# Patient Record
Sex: Male | Born: 1980 | Race: White | Hispanic: No | Marital: Married | Smoking: Never smoker
Health system: Southern US, Community
[De-identification: ages and names within clinical notes are randomized; demographics above are authoritative.]

## PROBLEM LIST (undated history)

## (undated) DIAGNOSIS — I10 Essential (primary) hypertension: Secondary | ICD-10-CM

## (undated) HISTORY — PX: BILATERAL CARPAL TUNNEL RELEASE: SHX6508

## (undated) HISTORY — PX: CARPAL TUNNEL WITH CUBITAL TUNNEL: SHX5608

---

## 2008-01-17 ENCOUNTER — Emergency Department: Payer: Self-pay | Admitting: Emergency Medicine

## 2014-08-29 DIAGNOSIS — G5603 Carpal tunnel syndrome, bilateral upper limbs: Secondary | ICD-10-CM | POA: Insufficient documentation

## 2015-06-14 DIAGNOSIS — R350 Frequency of micturition: Secondary | ICD-10-CM | POA: Insufficient documentation

## 2015-08-01 DIAGNOSIS — I10 Essential (primary) hypertension: Secondary | ICD-10-CM | POA: Insufficient documentation

## 2015-08-16 DIAGNOSIS — R202 Paresthesia of skin: Secondary | ICD-10-CM | POA: Insufficient documentation

## 2015-08-16 DIAGNOSIS — R200 Anesthesia of skin: Secondary | ICD-10-CM | POA: Insufficient documentation

## 2015-08-16 DIAGNOSIS — M79603 Pain in arm, unspecified: Secondary | ICD-10-CM | POA: Insufficient documentation

## 2015-10-24 DIAGNOSIS — R519 Headache, unspecified: Secondary | ICD-10-CM | POA: Insufficient documentation

## 2016-05-08 DIAGNOSIS — R3915 Urgency of urination: Secondary | ICD-10-CM | POA: Insufficient documentation

## 2016-05-08 DIAGNOSIS — R1031 Right lower quadrant pain: Secondary | ICD-10-CM | POA: Insufficient documentation

## 2016-05-11 DIAGNOSIS — M25532 Pain in left wrist: Secondary | ICD-10-CM | POA: Insufficient documentation

## 2016-05-22 DIAGNOSIS — R3129 Other microscopic hematuria: Secondary | ICD-10-CM | POA: Insufficient documentation

## 2016-05-27 DIAGNOSIS — M19032 Primary osteoarthritis, left wrist: Secondary | ICD-10-CM | POA: Insufficient documentation

## 2019-02-07 ENCOUNTER — Other Ambulatory Visit: Payer: Self-pay | Admitting: Gastroenterology

## 2019-02-07 ENCOUNTER — Other Ambulatory Visit (HOSPITAL_COMMUNITY): Payer: Self-pay | Admitting: Gastroenterology

## 2019-02-07 DIAGNOSIS — R1011 Right upper quadrant pain: Secondary | ICD-10-CM

## 2019-02-07 DIAGNOSIS — K8020 Calculus of gallbladder without cholecystitis without obstruction: Secondary | ICD-10-CM

## 2019-02-10 ENCOUNTER — Other Ambulatory Visit: Payer: Self-pay | Admitting: Internal Medicine

## 2019-02-10 ENCOUNTER — Other Ambulatory Visit: Payer: Self-pay

## 2019-02-10 ENCOUNTER — Ambulatory Visit
Admission: RE | Admit: 2019-02-10 | Discharge: 2019-02-10 | Disposition: A | Discharge status: Home / Self Care | Payer: BLUE CROSS/BLUE SHIELD | Source: Ambulatory Visit | Attending: Gastroenterology | Admitting: Gastroenterology

## 2019-02-10 DIAGNOSIS — R1011 Right upper quadrant pain: Secondary | ICD-10-CM

## 2019-02-10 DIAGNOSIS — R112 Nausea with vomiting, unspecified: Secondary | ICD-10-CM

## 2019-02-10 DIAGNOSIS — K8020 Calculus of gallbladder without cholecystitis without obstruction: Secondary | ICD-10-CM | POA: Diagnosis present

## 2019-02-15 ENCOUNTER — Ambulatory Visit
Admission: RE | Admit: 2019-02-15 | Discharge: 2019-02-15 | Disposition: A | Discharge status: Home / Self Care | Payer: BLUE CROSS/BLUE SHIELD | Source: Ambulatory Visit | Attending: Internal Medicine | Admitting: Internal Medicine

## 2019-02-15 ENCOUNTER — Other Ambulatory Visit: Payer: Self-pay

## 2019-02-15 DIAGNOSIS — R1011 Right upper quadrant pain: Secondary | ICD-10-CM | POA: Diagnosis present

## 2019-02-15 DIAGNOSIS — R112 Nausea with vomiting, unspecified: Secondary | ICD-10-CM | POA: Diagnosis present

## 2019-02-15 DIAGNOSIS — K8020 Calculus of gallbladder without cholecystitis without obstruction: Secondary | ICD-10-CM | POA: Diagnosis present

## 2019-02-15 MED ORDER — TECHNETIUM TC 99M MEBROFENIN IV KIT
5.3500 | PACK | Freq: Once | INTRAVENOUS | Status: AC | PRN
  Administered 2019-02-15: 09:00:00 5.35 via INTRAVENOUS

## 2019-02-23 ENCOUNTER — Other Ambulatory Visit
Admission: RE | Admit: 2019-02-23 | Discharge: 2019-02-23 | Disposition: A | Discharge status: Home / Self Care | Payer: BLUE CROSS/BLUE SHIELD | Source: Ambulatory Visit | Attending: Gastroenterology | Admitting: Gastroenterology

## 2019-02-23 ENCOUNTER — Other Ambulatory Visit: Payer: Self-pay | Admitting: Gastroenterology

## 2019-02-23 DIAGNOSIS — R1012 Left upper quadrant pain: Secondary | ICD-10-CM

## 2019-02-23 DIAGNOSIS — R197 Diarrhea, unspecified: Secondary | ICD-10-CM

## 2019-02-23 DIAGNOSIS — R112 Nausea with vomiting, unspecified: Secondary | ICD-10-CM

## 2019-02-23 DIAGNOSIS — R1013 Epigastric pain: Secondary | ICD-10-CM

## 2019-02-23 LAB — GASTROINTESTINAL PANEL BY PCR, STOOL (REPLACES STOOL CULTURE)

## 2019-02-23 LAB — C DIFFICILE QUICK SCREEN W PCR REFLEX
C Diff antigen: NEGATIVE
C Diff interpretation: NOT DETECTED
C Diff toxin: NEGATIVE

## 2019-02-26 LAB — CALPROTECTIN, FECAL: Calprotectin, Fecal: 68 ug/g (ref 0–120)

## 2019-03-03 ENCOUNTER — Other Ambulatory Visit: Payer: Self-pay

## 2019-03-03 ENCOUNTER — Ambulatory Visit
Admission: RE | Admit: 2019-03-03 | Discharge: 2019-03-03 | Disposition: A | Discharge status: Home / Self Care | Payer: BLUE CROSS/BLUE SHIELD | Source: Ambulatory Visit | Attending: Gastroenterology | Admitting: Gastroenterology

## 2019-03-03 DIAGNOSIS — R1012 Left upper quadrant pain: Secondary | ICD-10-CM | POA: Diagnosis present

## 2019-03-03 DIAGNOSIS — R1013 Epigastric pain: Secondary | ICD-10-CM | POA: Diagnosis present

## 2019-03-03 DIAGNOSIS — R112 Nausea with vomiting, unspecified: Secondary | ICD-10-CM | POA: Diagnosis present

## 2019-03-03 DIAGNOSIS — R197 Diarrhea, unspecified: Secondary | ICD-10-CM | POA: Diagnosis present

## 2019-03-03 HISTORY — DX: Essential (primary) hypertension: I10

## 2019-03-03 MED ORDER — IOHEXOL 300 MG/ML  SOLN
100.0000 mL | Freq: Once | INTRAMUSCULAR | Status: AC | PRN
  Administered 2019-03-03: 09:00:00 100 mL via INTRAVENOUS

## 2019-04-20 ENCOUNTER — Other Ambulatory Visit
Admission: RE | Admit: 2019-04-20 | Discharge: 2019-04-20 | Disposition: A | Discharge status: Home / Self Care | Payer: BC Managed Care – PPO | Source: Ambulatory Visit | Attending: Gastroenterology | Admitting: Gastroenterology

## 2019-04-20 DIAGNOSIS — K580 Irritable bowel syndrome with diarrhea: Secondary | ICD-10-CM | POA: Insufficient documentation

## 2019-04-26 LAB — PANCREATIC ELASTASE, FECAL: Pancreatic Elastase-1, Stool: >500 ug Elast./g (ref >200)

## 2019-05-11 ENCOUNTER — Other Ambulatory Visit: Payer: Self-pay

## 2019-05-12 ENCOUNTER — Other Ambulatory Visit: Payer: Self-pay

## 2019-05-12 ENCOUNTER — Encounter: Payer: Self-pay | Admitting: Oncology

## 2019-05-12 ENCOUNTER — Inpatient Hospital Stay: Payer: BC Managed Care – PPO | Attending: Oncology | Admitting: Oncology

## 2019-05-12 ENCOUNTER — Inpatient Hospital Stay: Payer: BC Managed Care – PPO

## 2019-05-12 VITALS — BP 168/94 | HR 105 | Temp 99.6°F | Wt 313.5 lb

## 2019-05-12 DIAGNOSIS — Z79899 Other long term (current) drug therapy: Secondary | ICD-10-CM | POA: Insufficient documentation

## 2019-05-12 DIAGNOSIS — D72829 Elevated white blood cell count, unspecified: Secondary | ICD-10-CM | POA: Diagnosis not present

## 2019-05-12 DIAGNOSIS — M25559 Pain in unspecified hip: Secondary | ICD-10-CM

## 2019-05-12 DIAGNOSIS — R197 Diarrhea, unspecified: Secondary | ICD-10-CM

## 2019-05-12 DIAGNOSIS — I10 Essential (primary) hypertension: Secondary | ICD-10-CM | POA: Diagnosis not present

## 2019-05-12 DIAGNOSIS — R5383 Other fatigue: Secondary | ICD-10-CM

## 2019-05-12 DIAGNOSIS — E538 Deficiency of other specified B group vitamins: Secondary | ICD-10-CM

## 2019-05-12 LAB — COMPREHENSIVE METABOLIC PANEL
ALT: 25 U/L (ref 0–44)
AST: 20 U/L (ref 15–41)
Albumin: 4.3 g/dL (ref 3.5–5.0)
Alkaline Phosphatase: 81 U/L (ref 38–126)
Anion gap: 9 (ref 5–15)
BUN: 14 mg/dL (ref 6–20)
CO2: 25 mmol/L (ref 22–32)
Calcium: 9.2 mg/dL (ref 8.9–10.3)
Chloride: 105 mmol/L (ref 98–111)
Creatinine, Ser: 0.94 mg/dL (ref 0.61–1.24)
GFR calc Af Amer: >60 mL/min (ref >60)
GFR calc non Af Amer: >60 mL/min (ref >60)
Glucose, Bld: 98 mg/dL (ref 70–99)
Potassium: 3.9 mmol/L (ref 3.5–5.1)
Sodium: 139 mmol/L (ref 135–145)
Total Bilirubin: 0.6 mg/dL (ref 0.3–1.2)
Total Protein: 8.2 g/dL — ABNORMAL HIGH (ref 6.5–8.1)

## 2019-05-12 LAB — CBC WITH DIFFERENTIAL/PLATELET
Abs Immature Granulocytes: 0.03 10*3/uL (ref 0.00–0.07)
Basophils Absolute: 0 10*3/uL (ref 0.0–0.1)
Basophils Relative: 0 %
Eosinophils Absolute: 0 10*3/uL (ref 0.0–0.5)
Eosinophils Relative: 0 %
HCT: 46.1 % (ref 39.0–52.0)
Hemoglobin: 15.2 g/dL (ref 13.0–17.0)
Immature Granulocytes: 0 %
Lymphocytes Relative: 23 %
Lymphs Abs: 2.6 10*3/uL (ref 0.7–4.0)
MCH: 27.6 pg (ref 26.0–34.0)
MCHC: 33 g/dL (ref 30.0–36.0)
MCV: 83.8 fL (ref 80.0–100.0)
Monocytes Absolute: 0.8 10*3/uL (ref 0.1–1.0)
Monocytes Relative: 7 %
Neutro Abs: 7.6 10*3/uL (ref 1.7–7.7)
Neutrophils Relative %: 70 %
Platelets: 390 10*3/uL (ref 150–400)
RBC: 5.5 MIL/uL (ref 4.22–5.81)
RDW: 13.1 % (ref 11.5–15.5)
WBC: 11.1 10*3/uL — ABNORMAL HIGH (ref 4.0–10.5)
nRBC: 0 % (ref 0.0–0.2)

## 2019-05-12 LAB — TECHNOLOGIST SMEAR REVIEW

## 2019-05-12 LAB — URIC ACID: Uric Acid, Serum: 7.8 mg/dL (ref 3.7–8.6)

## 2019-05-12 LAB — IRON AND TIBC
Iron: 68 ug/dL (ref 45–182)
Saturation Ratios: 19 % (ref 17.9–39.5)
TIBC: 360 ug/dL (ref 250–450)
UIBC: 292 ug/dL

## 2019-05-12 LAB — C-REACTIVE PROTEIN: CRP: <0.8 mg/dL (ref <1.0)

## 2019-05-12 LAB — VITAMIN B12: Vitamin B-12: 580 pg/mL (ref 180–914)

## 2019-05-12 LAB — FERRITIN: Ferritin: 91 ng/mL (ref 24–336)

## 2019-05-12 LAB — LACTATE DEHYDROGENASE: LDH: 140 U/L (ref 98–192)

## 2019-05-13 ENCOUNTER — Encounter: Payer: Self-pay | Admitting: Oncology

## 2019-05-13 ENCOUNTER — Other Ambulatory Visit: Payer: Self-pay | Admitting: *Deleted

## 2019-05-13 DIAGNOSIS — E538 Deficiency of other specified B group vitamins: Secondary | ICD-10-CM

## 2019-05-13 DIAGNOSIS — R5383 Other fatigue: Secondary | ICD-10-CM | POA: Insufficient documentation

## 2019-05-13 DIAGNOSIS — D72829 Elevated white blood cell count, unspecified: Secondary | ICD-10-CM

## 2019-05-13 DIAGNOSIS — R197 Diarrhea, unspecified: Secondary | ICD-10-CM | POA: Insufficient documentation

## 2019-05-13 DIAGNOSIS — M25559 Pain in unspecified hip: Secondary | ICD-10-CM

## 2019-05-13 LAB — ANA W/REFLEX: Anti Nuclear Antibody (ANA): NEGATIVE

## 2019-05-13 NOTE — Progress Notes (Signed)
Hematology/Oncology Consult note [facility] Telephone:([phone] [phone]   Patient Care Team: Ricardo Jericho, NP as PCP - General (Family Medicine)  REFERRING PROVIDER: [facility] Mad[facility]  CHIEF COMPLAINTS/REASON FOR VISIT:  Evaluation of leukocytosis  HISTORY OF PRESENTING ILLNESS:  Andrew Khim. is a  38 y.o.  male with PMH listed below was seen at the request of Dr. Madolyn Frieze for consultation of leukocytosis. Patient's care has been with [facility]  Extensive medical records review was performed via care everywhere Reviewed patient' recent labs  04/19/2019 WBC 11.1, hemoglobin 14.4, MCV 85.6, platelet 375. Differential low eosinophil percentage. Vitamin D level 25.4 03/19/2019, chemistry showed sodium 142, potassium 4.3, calcium 9.1, bilirubin 0.4, alkaline phosphatase 77, AST 24, ALT 30. TSH 1.695 Vitamin B12 level 160. CBC showed elevated white count ofPrevious lab records reviewed. Leukocytosis onset of chronic, duration is since  ESR 39 03/14/2015 HIV negative. She has a history of increased total protein.  03/11/2016 serum protein electrophoresis showed no monoclonal component identified. 03/11/2016 ANA positive.    No aggravating or elevated factors. Associat he had ed symptoms or signs:  Denies weight loss, fever, chills,night sweats.  Fatigue Smoking history: Never smoker. History of recent oral steroid use or steroid injection: Denies History of recent infection: Denies Autoimmune disease history.  Denies  Patient complains generalized abdominal pain and diarrhea.  He has colonoscopy performed on 03/28/2019 with Dr. Alice Reichert showing examined portion of the ileum normal, entire examined colon normal and pathology showing terminal ileum biopsies with no diagnostic abnormalities no evidence of idiopathic inflammatory bowel disease, right  colon left colon rectal biopsies negative for any active or specific colitis. Diarrhea with average 4 bowel movements daily, watery. Patient currently is on short-term disability.  He mentioned that he used to work for [employer] department and potentially be exposed to toxins.  He mentioned 1 of his colleagues was having similar symptoms end up being diagnosed with pancreatic cancer. Working diagnosis is IBS.  Patient is being referred out for tertiary center for further work-up of possible bacterial overgrowth. Patient has multiple complaints today.  Also have chronic pelvic and thigh pain  Reports appetite is not very good.  Weight is 313 pounds, he weighed 313 pounds on 02/01/2019 at Dr. Ricky Stabs office.  No significant weight loss during past 3 months. He weighed 323 pounds on 03/23/2018.   Review of Systems  Constitutional: Positive for fatigue. Negative for appetite change, chills, fever and unexpected weight change.  HENT:   Negative for hearing loss and voice change.   Eyes: Negative for eye problems and icterus.  Respiratory: Negative for chest tightness, cough and shortness of breath.   Cardiovascular: Negative for chest pain and leg swelling.  Gastrointestinal: Positive for abdominal pain and diarrhea. Negative for abdominal distention.  Endocrine: Negative for hot flashes.  Genitourinary: Negative for difficulty urinating, dysuria and frequency.   Musculoskeletal: Negative for arthralgias.  Skin: Negative for itching and rash.  Neurological: Negative for light-headedness and numbness.  Hematological: Negative for adenopathy. Does not bruise/bleed easily.  Psychiatric/Behavioral: Negative for confusion.     MEDICAL HISTORY:  Past Medical History:  Diagnosis Date   Hypertension     SURGICAL HISTORY: History reviewed. No pertinent surgical history.  SOCIAL HISTORY: Social History   Socioeconomic History   Marital status: Married    Spouse name: Not on file   Number of  children: Not on file  Years of education: Not on file   Highest education level: Not on file  Occupational History   Not on file  Social Needs   Financial resource strain: Not on file   Food insecurity    Worry: Not on file    Inability: Not on file   Transportation needs    Medical: Not on file    Non-medical: Not on file  Tobacco Use   Smoking status: Never Smoker   Smokeless tobacco: Never Used  Substance and Sexual Activity   Alcohol use: Never    Frequency: Never   Drug use: Never   Sexual activity: Not on file  Lifestyle   Physical activity    Days per week: Not on file    Minutes per session: Not on file   Stress: Not on file  Relationships   Social connections    Talks on phone: Not on file    Gets together: Not on file    Attends religious service: Not on file    Active member of club or organization: Not on file    Attends meetings of clubs or organizations: Not on file    Relationship status: Not on file   Intimate partner violence    Fear of current or ex partner: Not on file    Emotionally abused: Not on file    Physically abused: Not on file    Forced sexual activity: Not on file  Other Topics Concern   Not on file  Social History Narrative   Not on file    FAMILY HISTORY: Family History  Problem Relation Age of Onset   Cancer Maternal Aunt     ALLERGIES:  has no allergies on file.  MEDICATIONS:  Current Outpatient Medications  Medication Sig Dispense Refill   pantoprazole (PROTONIX) 40 MG tablet Take by mouth.     No current facility-administered medications for this visit.      PHYSICAL EXAMINATION: ECOG PERFORMANCE STATUS: 1 - Symptomatic but completely ambulatory Vitals:   05/12/19 0958  BP: (!) 168/94  Pulse: (!) 105  Temp: 99.6 F (37.6 C)   Filed Weights   05/12/19 0958  Weight: (!) 313 lb 8 oz (142.2 kg)    Physical Exam Constitutional:      General: He is not in acute distress.    Appearance: He  is obese.  HENT:     Head: Normocephalic and atraumatic.  Eyes:     General: No scleral icterus.    Pupils: Pupils are equal, round, and reactive to light.  Neck:     Musculoskeletal: Normal range of motion and neck supple.  Cardiovascular:     Rate and Rhythm: Normal rate and regular rhythm.     Heart sounds: Normal heart sounds.  Pulmonary:     Effort: Pulmonary effort is normal. No respiratory distress.     Breath sounds: No wheezing.  Abdominal:     General: Bowel sounds are normal. There is no distension.     Palpations: Abdomen is soft. There is no mass.     Tenderness: There is no abdominal tenderness.  Musculoskeletal: Normal range of motion.        General: No deformity.  Skin:    General: Skin is warm and dry.     Findings: No erythema or rash.  Neurological:     Mental Status: He is alert and oriented to person, place, and time.     Cranial Nerves: No cranial nerve deficit.  Coordination: Coordination normal.  Psychiatric:        Behavior: Behavior normal.        Thought Content: Thought content normal.     CMP Latest Ref Rng & Units 05/12/2019  Glucose 70 - 99 mg/dL 98  BUN 6 - 20 mg/dL 14  Creatinine 0.61 - 1.24 mg/dL 0.94  Sodium 135 - 145 mmol/L 139  Potassium 3.5 - 5.1 mmol/L 3.9  Chloride 98 - 111 mmol/L 105  CO2 22 - 32 mmol/L 25  Calcium 8.9 - 10.3 mg/dL 9.2  Total Protein 6.5 - 8.1 g/dL 8.2(H)  Total Bilirubin 0.3 - 1.2 mg/dL 0.6  Alkaline Phos 38 - 126 U/L 81  AST 15 - 41 U/L 20  ALT 0 - 44 U/L 25   CBC Latest Ref Rng & Units 05/12/2019  WBC 4.0 - 10.5 K/uL 11.1(H)  Hemoglobin 13.0 - 17.0 g/dL 15.2  Hematocrit 39.0 - 52.0 % 46.1  Platelets 150 - 400 K/uL 390     No results found.  LABORATORY DATA:  I have reviewed the data as listed Lab Results  Component Value Date   WBC 11.1 (H) 05/12/2019   HGB 15.2 05/12/2019   HCT 46.1 05/12/2019   MCV 83.8 05/12/2019   PLT 390 05/12/2019   Recent Labs    05/12/19 1100  NA 139  K 3.9    CL 105  CO2 25  GLUCOSE 98  BUN 14  CREATININE 0.94  CALCIUM 9.2  GFRNONAA >60  GFRAA >60  PROT 8.2*  ALBUMIN 4.3  AST 20  ALT 25  ALKPHOS 81  BILITOT 0.6   Iron/TIBC/Ferritin/ %Sat    Component Value Date/Time   IRON 68 05/12/2019 1100   TIBC 360 05/12/2019 1100   FERRITIN 91 05/12/2019 1100   IRONPCTSAT 19 05/12/2019 1100     RADIOGRAPHIC STUDIES: I have personally reviewed the radiological images as listed and agreed with the findings in the report. 03/03/2019 CT abdomen pelvis with contrast  Showed a fat-containing right inguinal hernia.  No bowel containing hernia evident.  No demonstratable bowel obstruction.  No abscess in the abdomen or pelvis.   ASSESSMENT & PLAN:  1. Leukocytosis, unspecified type   2. Other fatigue   3. Diarrhea, unspecified type   4. Pain in joint involving pelvic region and thigh, unspecified laterality    Labs reviewed and discussed with patient that Leukocytosis, predominantly neutrophilia, can be secondary to infection, chronic inflammation,  autoimmune disease, or underlying bone marrow disorders.   Likely reactive. For the work up of patient's leukocytosis, I recommend checking CBC;CMP, LDH, smear review, vitamin B12, uric acid, CRP, ANA, iron, TIBC, ferritin, flowcytometry, etc.   Fatigue, nonspecific.  Diarrhea follow-up with gastroenterology.  He is being referred out to [facility] for second opinion. Chronic joint pain, check CRP, ANA.  Pending above work-up.   Orders Placed This Encounter  Procedures   Giardia, EIA; Ova/Parasite    Standing Status:   Future    Number of Occurrences:   1    Standing Expiration Date:   05/11/2020   CBC with Differential/Platelet    Standing Status:   Future    Standing Expiration Date:   05/11/2020   Technologist smear review    Standing Status:   Future    Standing Expiration Date:   05/11/2020   CBC with Differential/Platelet    Standing Status:   Future    Number of Occurrences:   1     Standing Expiration Date:  05/11/2020   Technologist smear review    Standing Status:   Future    Number of Occurrences:   1    Standing Expiration Date:   05/11/2020   Flow cytometry panel-leukemia/lymphoma work-up    Standing Status:   Future    Number of Occurrences:   1    Standing Expiration Date:   05/11/2020   Vitamin B12    Standing Status:   Future    Number of Occurrences:   1    Standing Expiration Date:   05/11/2020   Lactate dehydrogenase    Standing Status:   Future    Number of Occurrences:   1    Standing Expiration Date:   05/11/2020   Uric acid    Standing Status:   Future    Number of Occurrences:   1    Standing Expiration Date:   05/11/2020   C-reactive protein    Standing Status:   Future    Number of Occurrences:   1    Standing Expiration Date:   05/11/2020   ANA w/Reflex    Standing Status:   Future    Number of Occurrences:   1    Standing Expiration Date:   05/11/2020   Comprehensive metabolic panel    Standing Status:   Future    Number of Occurrences:   1    Standing Expiration Date:   05/11/2020   Iron and TIBC    Standing Status:   Future    Number of Occurrences:   1    Standing Expiration Date:   05/11/2020   Ferritin    Standing Status:   Future    Number of Occurrences:   1    Standing Expiration Date:   05/11/2020    All questions were answered. The patient knows to call the clinic with any problems questions or concerns.  Return of visit: 2 weeks to discuss labs. Thank you for this kind referral and the opportunity to participate in the care of this patient. A copy of today's note is routed to referring provider  Total face to face encounter time for this patient visit was 45 min. >50% of the time was  spent in counseling and coordination of care.    Earlie Server, MD, PhD Hematology Oncology [facility] at [facility] Pager- [phone] 05/13/2019

## 2019-05-16 LAB — COMP PANEL: LEUKEMIA/LYMPHOMA

## 2019-05-23 LAB — GIARDIA, EIA; OVA/PARASITE: Giardia Ag, Stl: NEGATIVE

## 2019-05-23 LAB — O&P RESULT

## 2019-05-26 ENCOUNTER — Inpatient Hospital Stay: Payer: BC Managed Care – PPO | Attending: Oncology | Admitting: Oncology

## 2019-05-26 ENCOUNTER — Encounter: Payer: Self-pay | Admitting: Oncology

## 2019-05-26 ENCOUNTER — Other Ambulatory Visit: Payer: Self-pay

## 2019-05-26 ENCOUNTER — Inpatient Hospital Stay: Payer: BC Managed Care – PPO

## 2019-05-26 VITALS — BP 152/92 | HR 100 | Temp 99.4°F | Wt 313.6 lb

## 2019-05-26 DIAGNOSIS — E538 Deficiency of other specified B group vitamins: Secondary | ICD-10-CM | POA: Diagnosis not present

## 2019-05-26 DIAGNOSIS — D72829 Elevated white blood cell count, unspecified: Secondary | ICD-10-CM

## 2019-05-26 DIAGNOSIS — R197 Diarrhea, unspecified: Secondary | ICD-10-CM | POA: Diagnosis not present

## 2019-05-26 DIAGNOSIS — M25559 Pain in unspecified hip: Secondary | ICD-10-CM

## 2019-05-26 DIAGNOSIS — I10 Essential (primary) hypertension: Secondary | ICD-10-CM | POA: Insufficient documentation

## 2019-05-26 DIAGNOSIS — R109 Unspecified abdominal pain: Secondary | ICD-10-CM | POA: Diagnosis not present

## 2019-05-26 NOTE — Progress Notes (Signed)
Hematology/Oncology  [facility] Telephone:([phone] [phone]   Patient Care Team: Ricardo Jericho, NP as PCP - General (Family Medicine)  [facility]  CHIEF COMPLAINTS/REASON FOR VISIT:  Evaluation of leukocytosis  HISTORY OF PRESENTING ILLNESS:  Andrew Ybarra. is a  38 y.o.  male with PMH listed below was seen at the request of Dr. Madolyn Frieze for consultation of leukocytosis. Patient's care has been with [facility]  Extensive medical records review was performed via care everywhere Reviewed patient' recent labs  04/19/2019 WBC 11.1, hemoglobin 14.4, MCV 85.6, platelet 375. Differential low eosinophil percentage. Vitamin D level 25.4 03/19/2019, chemistry showed sodium 142, potassium 4.3, calcium 9.1, bilirubin 0.4, alkaline phosphatase 77, AST 24, ALT 30. TSH 1.695 Vitamin B12 level 160. CBC showed elevated white count ofPrevious lab records reviewed. Leukocytosis onset of chronic, duration is since  ESR 39 03/14/2015 HIV negative. She has a history of increased total protein.  03/11/2016 serum protein electrophoresis showed no monoclonal component identified. 03/11/2016 ANA positive.    No aggravating or elevated factors. Associat he had ed symptoms or signs:  Denies weight loss, fever, chills,night sweats.  Fatigue Smoking history: Never smoker. History of recent oral steroid use or steroid injection: Denies History of recent infection: Denies Autoimmune disease history.  Denies  Patient complains generalized abdominal pain and diarrhea.  He has colonoscopy performed on 03/28/2019 with Dr. Alice Reichert showing examined portion of the ileum normal, entire examined colon normal and pathology showing terminal ileum biopsies with no diagnostic abnormalities no evidence of idiopathic inflammatory bowel disease, right colon left  colon rectal biopsies negative for any active or specific colitis. Diarrhea with average 4 bowel movements daily, watery. Patient currently is on short-term disability.  He mentioned that he used to work for [employer] department and potentially be exposed to toxins.  He mentioned 1 of his colleagues was having similar symptoms end up being diagnosed with pancreatic cancer. Working diagnosis is IBS.  Patient is being referred out for tertiary center for further work-up of possible bacterial overgrowth. Patient has multiple complaints today.  Also have chronic pelvic and thigh pain  Reports appetite is not very good.  Weight is 313 pounds, he weighed 313 pounds on 02/01/2019 at Dr. Ricky Stabs office.  No significant weight loss during past 3 months. He weighed 323 pounds on 03/23/2018.   INTERVAL HISTORY Andrew Fleet. is a 38 y.o. male who has above history reviewed by me today presents for follow up visit for leukocytosis.  Problems and complaints are listed below: He had lab work up done during the interval and presents to discuss results.  Still have ongoing abdominal discomfort and intermittent diarrhea     Review of Systems  Constitutional: Positive for fatigue. Negative for appetite change, chills, fever and unexpected weight change.  HENT:   Negative for hearing loss and voice change.   Eyes: Negative for eye problems and icterus.  Respiratory: Negative for chest tightness, cough and shortness of breath.   Cardiovascular: Negative for chest pain and leg swelling.  Gastrointestinal: Positive for abdominal pain and diarrhea. Negative for abdominal distention.  Endocrine: Negative for hot flashes.  Genitourinary: Negative for difficulty urinating, dysuria and frequency.   Musculoskeletal: Negative for arthralgias.  Skin: Negative for itching and rash.  Neurological: Negative for light-headedness and numbness.  Hematological: Negative for adenopathy. Does not bruise/bleed easily.   Psychiatric/Behavioral: Negative for confusion.  MEDICAL HISTORY:  Past Medical History:  Diagnosis Date  . Hypertension     SURGICAL HISTORY: Past Surgical History:  Procedure Laterality Date  . BILATERAL CARPAL TUNNEL RELEASE    . CARPAL TUNNEL WITH CUBITAL TUNNEL      SOCIAL HISTORY: Social History   Socioeconomic History  . Marital status: Married    Spouse name: Not on file  . Number of children: Not on file  . Years of education: Not on file  . Highest education level: Not on file  Occupational History  . Not on file  Social Needs  . Financial resource strain: Not on file  . Food insecurity    Worry: Not on file    Inability: Not on file  . Transportation needs    Medical: Not on file    Non-medical: Not on file  Tobacco Use  . Smoking status: Never Smoker  . Smokeless tobacco: Never Used  Substance and Sexual Activity  . Alcohol use: Never    Frequency: Never  . Drug use: Never  . Sexual activity: Not on file  Lifestyle  . Physical activity    Days per week: Not on file    Minutes per session: Not on file  . Stress: Not on file  Relationships  . Social Herbalist on phone: Not on file    Gets together: Not on file    Attends religious service: Not on file    Active member of club or organization: Not on file    Attends meetings of clubs or organizations: Not on file    Relationship status: Not on file  . Intimate partner violence    Fear of current or ex partner: Not on file    Emotionally abused: Not on file    Physically abused: Not on file    Forced sexual activity: Not on file  Other Topics Concern  . Not on file  Social History Narrative  . Not on file    FAMILY HISTORY: Family History  Problem Relation Age of Onset  . Lymphoma Maternal Aunt     ALLERGIES:  has No Known Allergies.  MEDICATIONS:  Current Outpatient Medications  Medication Sig Dispense Refill  . pantoprazole (PROTONIX) 40 MG tablet Take by mouth.     Marland Kitchen XIFAXAN 550 MG TABS tablet TAKE 1 TABLET (550 MG TOTAL) BY MOUTH 3 (THREE) TIMES DAILY FOR 14 DAYS     No current facility-administered medications for this visit.      PHYSICAL EXAMINATION: ECOG PERFORMANCE STATUS: 1 - Symptomatic but completely ambulatory Vitals:   05/26/19 0852  BP: (!) 152/92  Pulse: 100  Temp: 99.4 F (37.4 C)   Filed Weights   05/26/19 0852  Weight: (!) 313 lb 9 oz (142.2 kg)    Physical Exam Constitutional:      General: He is not in acute distress.    Appearance: He is obese.  HENT:     Head: Normocephalic and atraumatic.  Eyes:     General: No scleral icterus.    Pupils: Pupils are equal, round, and reactive to light.  Neck:     Musculoskeletal: Normal range of motion and neck supple.  Cardiovascular:     Rate and Rhythm: Normal rate and regular rhythm.     Heart sounds: Normal heart sounds.  Pulmonary:     Effort: Pulmonary effort is normal. No respiratory distress.     Breath sounds: No wheezing.  Abdominal:     General:  Bowel sounds are normal. There is no distension.     Palpations: Abdomen is soft. There is no mass.     Tenderness: There is no abdominal tenderness.  Musculoskeletal: Normal range of motion.        General: No deformity.  Skin:    General: Skin is warm and dry.     Findings: No erythema or rash.  Neurological:     Mental Status: He is alert and oriented to person, place, and time.     Cranial Nerves: No cranial nerve deficit.     Coordination: Coordination normal.  Psychiatric:        Behavior: Behavior normal.        Thought Content: Thought content normal.     CMP Latest Ref Rng & Units 05/12/2019  Glucose 70 - 99 mg/dL 98  BUN 6 - 20 mg/dL 14  Creatinine 0.61 - 1.24 mg/dL 0.94  Sodium 135 - 145 mmol/L 139  Potassium 3.5 - 5.1 mmol/L 3.9  Chloride 98 - 111 mmol/L 105  CO2 22 - 32 mmol/L 25  Calcium 8.9 - 10.3 mg/dL 9.2  Total Protein 6.5 - 8.1 g/dL 8.2(H)  Total Bilirubin 0.3 - 1.2 mg/dL 0.6   Alkaline Phos 38 - 126 U/L 81  AST 15 - 41 U/L 20  ALT 0 - 44 U/L 25   CBC Latest Ref Rng & Units 05/12/2019  WBC 4.0 - 10.5 K/uL 11.1(H)  Hemoglobin 13.0 - 17.0 g/dL 15.2  Hematocrit 39.0 - 52.0 % 46.1  Platelets 150 - 400 K/uL 390     No results found.  LABORATORY DATA:  I have reviewed the data as listed Lab Results  Component Value Date   WBC 11.1 (H) 05/12/2019   HGB 15.2 05/12/2019   HCT 46.1 05/12/2019   MCV 83.8 05/12/2019   PLT 390 05/12/2019   Recent Labs    05/12/19 1100  NA 139  K 3.9  CL 105  CO2 25  GLUCOSE 98  BUN 14  CREATININE 0.94  CALCIUM 9.2  GFRNONAA >60  GFRAA >60  PROT 8.2*  ALBUMIN 4.3  AST 20  ALT 25  ALKPHOS 81  BILITOT 0.6   Iron/TIBC/Ferritin/ %Sat    Component Value Date/Time   IRON 68 05/12/2019 1100   TIBC 360 05/12/2019 1100   FERRITIN 91 05/12/2019 1100   IRONPCTSAT 19 05/12/2019 1100     RADIOGRAPHIC STUDIES: I have personally reviewed the radiological images as listed and agreed with the findings in the report. 03/03/2019 CT abdomen pelvis with contrast  Showed a fat-containing right inguinal hernia.  No bowel containing hernia evident.  No demonstratable bowel obstruction.  No abscess in the abdomen or pelvis.   ASSESSMENT & PLAN:  1. Leukocytosis, unspecified type   2. B12 deficiency   3. Diarrhea, unspecified type   4. Pain in joint involving pelvic region and thigh, unspecified laterality    #Labs reviewed and discussed with patient. CBC showed white count 11.1, complete normal differential. Smear showed lymphocyte very in size with small and large cells noted and  reactive lymphocytes noted. Normal B12 level at 580 Normal LDH and uric acid. Iron panel nonremarkable.  No deficiency overload. C-reactive protein and ANA with reflex negative.  CMP showed increased total protein level.  Consider obtaining multiple myeloma panel in the future. 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  No  other significant diagnostic immunophenotypic abnormality detected. Recommend T-cell receptor gene rearrangement studies. .   Orders Placed  This Encounter  Procedures  . Miscellaneous [facility] test (send-out)    T-cell receptor gene rearrangement, [facility] [id number]    Standing Status:   Future    Number of Occurrences:   1    Standing Expiration Date:   05/25/2020    Order Specific Question:   Test name / description:    Answer:   T-cell receptor gene rearrangement, [facility] [id number]    All questions were answered. The patient knows to call the clinic with any problems questions or concerns.  Return of visit: Awaiting above results.  To be determined. Thank you for this kind referral and the opportunity to participate in the care of this patient. A copy of today's note is routed to referring provider  Total face to face encounter time for this patient visit was 45 min. >50% of the time was  spent in counseling and coordination of care.    Earlie Server, MD, PhD Hematology Oncology [facility] at [facility] Pager- [phone] 05/26/2019

## 2019-05-26 NOTE — Progress Notes (Signed)
Patient here today for follow up.  Patient c/o diarrhea, nausea, vomiting and abdominal pain.

## 2019-05-31 LAB — MISC LABCORP TEST (SEND OUT)
LabCorp test name: 481080
Labcorp test code: 481080

## 2019-06-14 ENCOUNTER — Other Ambulatory Visit: Payer: Self-pay | Admitting: Gastroenterology

## 2019-06-14 DIAGNOSIS — R1084 Generalized abdominal pain: Secondary | ICD-10-CM

## 2019-06-14 DIAGNOSIS — R1013 Epigastric pain: Secondary | ICD-10-CM

## 2019-06-21 ENCOUNTER — Other Ambulatory Visit: Payer: Self-pay | Admitting: Oncology

## 2019-06-21 DIAGNOSIS — D72829 Elevated white blood cell count, unspecified: Secondary | ICD-10-CM

## 2019-06-22 NOTE — Progress Notes (Signed)
Patient is coming in for follow up, would like to know lab results.

## 2019-06-23 ENCOUNTER — Encounter: Payer: Self-pay | Admitting: Oncology

## 2019-06-23 ENCOUNTER — Inpatient Hospital Stay: Payer: BC Managed Care – PPO | Attending: Oncology | Admitting: Oncology

## 2019-06-23 ENCOUNTER — Other Ambulatory Visit: Payer: Self-pay

## 2019-06-23 VITALS — BP 118/54 | HR 102 | Temp 99.5°F | Resp 18 | Wt 314.0 lb

## 2019-06-23 DIAGNOSIS — Z79899 Other long term (current) drug therapy: Secondary | ICD-10-CM | POA: Diagnosis not present

## 2019-06-23 DIAGNOSIS — R197 Diarrhea, unspecified: Secondary | ICD-10-CM | POA: Insufficient documentation

## 2019-06-23 DIAGNOSIS — Z807 Family history of other malignant neoplasms of lymphoid, hematopoietic and related tissues: Secondary | ICD-10-CM | POA: Diagnosis not present

## 2019-06-23 DIAGNOSIS — R1084 Generalized abdominal pain: Secondary | ICD-10-CM | POA: Diagnosis not present

## 2019-06-23 DIAGNOSIS — I10 Essential (primary) hypertension: Secondary | ICD-10-CM | POA: Diagnosis not present

## 2019-06-23 DIAGNOSIS — D72829 Elevated white blood cell count, unspecified: Secondary | ICD-10-CM

## 2019-06-23 NOTE — Progress Notes (Signed)
Pt in for follow up, reports was nauseous this morning.  Pt states that a CMA called yesterday to review his information from cancer center.

## 2019-06-24 ENCOUNTER — Ambulatory Visit
Admission: RE | Admit: 2019-06-24 | Discharge: 2019-06-24 | Disposition: A | Discharge status: Home / Self Care | Payer: BC Managed Care – PPO | Source: Ambulatory Visit | Attending: Gastroenterology | Admitting: Gastroenterology

## 2019-06-24 DIAGNOSIS — R1013 Epigastric pain: Secondary | ICD-10-CM | POA: Insufficient documentation

## 2019-06-24 DIAGNOSIS — R1084 Generalized abdominal pain: Secondary | ICD-10-CM | POA: Insufficient documentation

## 2019-06-24 NOTE — Progress Notes (Signed)
Hematology/Oncology  [facility] [phone] [phone]   Patient Care Team: Andrew Jericho, NP as PCP - General (Family Medicine)  [facility]  CHIEF COMPLAINTS/REASON FOR VISIT:  Evaluation of leukocytosis  HISTORY OF PRESENTING ILLNESS:  Andrew Mcmiller. is a  38 y.o.  male with PMH listed below was seen at the request of Dr. Madolyn Frieze for consultation of leukocytosis. Patient's care has been with [facility]  Extensive medical records review was performed via care everywhere Reviewed patient' recent labs  04/19/2019 WBC 11.1, hemoglobin 14.4, MCV 85.6, platelet 375. Differential low eosinophil percentage. Vitamin D level 25.4 03/19/2019, chemistry showed sodium 142, potassium 4.3, calcium 9.1, bilirubin 0.4, alkaline phosphatase 77, AST 24, ALT 30. TSH 1.695 Vitamin B12 level 160. CBC showed elevated white count ofPrevious lab records reviewed. Leukocytosis onset of chronic, duration is since  ESR 39 03/14/2015 HIV negative. She has a history of increased total protein.  03/11/2016 serum protein electrophoresis showed no monoclonal component identified. 03/11/2016 ANA positive.    No aggravating or elevated factors. Associat he had ed symptoms or signs:  Denies weight loss, fever, chills,night sweats.  Fatigue Smoking history: Never smoker. History of recent oral steroid use or steroid injection: Denies History of recent infection: Denies Autoimmune disease history.  Denies  Patient complains generalized abdominal pain and diarrhea.  He has colonoscopy performed on 03/28/2019 with Dr. Alice Reichert showing examined portion of the ileum normal, entire examined colon normal and pathology showing terminal ileum biopsies with no diagnostic abnormalities no evidence of idiopathic inflammatory bowel disease, right colon left  colon rectal biopsies negative for any active or specific colitis. Diarrhea with average 4 bowel movements daily, watery. Patient currently is on short-term disability.  He mentioned that he used to work for [employer] department and potentially be exposed to toxins.  He mentioned 1 of his colleagues was having similar symptoms end up being diagnosed with pancreatic cancer. Working diagnosis is IBS.  Patient is being referred out for tertiary center for further work-up of possible bacterial overgrowth. Patient has multiple complaints today.  Also have chronic pelvic and thigh pain  Reports appetite is not very good.  Weight is 313 pounds, he weighed 313 pounds on 02/01/2019 at Dr. Ricky Stabs office.  No significant weight loss during past 3 months. He weighed 323 pounds on 03/23/2018.   # CBC showed white count 11.1, complete normal differential. Smear showed lymphocyte very in size with small and large cells noted and  reactive lymphocytes noted. Normal B12 level at 580 Normal LDH and uric acid. Iron panel nonremarkable.  No deficiency overload. C-reactive protein and ANA with reflex negative   INTERVAL HISTORY Andrew Lozano. is a 38 y.o. male who has above history reviewed by me today presents for follow up visit for leukocytosis.  Problems and complaints are listed below: Patient presents to discuss lab work-up results and management plan. Continue to have ongoing chronic abdominal discomfort and intermittent diarrhea. Denies any fever, chills, unintentional weight loss, night sweating. Appetite is fair. Weight is stable. Gain one pound since last visit  #Patient had [place] mesenteric arteries which did not show any significant visceral artery stenosis.  Review of Systems  Constitutional: Positive for fatigue. Negative for appetite change, chills, fever and unexpected weight change.  HENT:   Negative for hearing loss and voice change.   Eyes: Negative for eye problems and icterus.  Respiratory: Negative for chest tightness, cough and shortness of breath.   Cardiovascular: Negative for chest pain and leg swelling.  Gastrointestinal: Positive for abdominal pain and diarrhea. Negative for abdominal distention.  Endocrine: Negative for hot flashes.  Genitourinary: Negative for difficulty urinating, dysuria and frequency.   Musculoskeletal: Negative for arthralgias.  Skin: Negative for itching and rash.  Neurological: Negative for light-headedness and numbness.  Hematological: Negative for adenopathy. Does not bruise/bleed easily.  Psychiatric/Behavioral: Negative for confusion.     MEDICAL HISTORY:  Past Medical History:  Diagnosis Date  . Hypertension     SURGICAL HISTORY: Past Surgical History:  Procedure Laterality Date  . BILATERAL CARPAL TUNNEL RELEASE    . CARPAL TUNNEL WITH CUBITAL TUNNEL      SOCIAL HISTORY: Social History   Socioeconomic History  . Marital status: Married    Spouse name: Not on file  . Number of children: Not on file  . Years of education: Not on file  . Highest education level: Not on file  Occupational History  . Not on file  Social Needs  . Financial resource strain: Not on file  . Food insecurity    Worry: Not on file    Inability: Not on file  . Transportation needs    Medical: Not on file    Non-medical: Not on file  Tobacco Use  . Smoking status: Never Smoker  . Smokeless tobacco: Never Used  Substance and Sexual Activity  . Alcohol use: Never    Frequency: Never  . Drug use: Never  . Sexual activity: Not on file  Lifestyle  . Physical activity    Days per week: Not on file    Minutes per session: Not on file  . Stress: Not on file  Relationships  . Social Herbalist on phone: Not on file    Gets together: Not on file    Attends religious service: Not on file    Active member of club or organization: Not on file    Attends meetings of clubs or organizations: Not on file    Relationship  status: Not on file  . Intimate partner violence    Fear of current or ex partner: Not on file    Emotionally abused: Not on file    Physically abused: Not on file    Forced sexual activity: Not on file  Other Topics Concern  . Not on file  Social History Narrative  . Not on file    FAMILY HISTORY: Family History  Problem Relation Age of Onset  . Lymphoma Maternal Aunt     ALLERGIES:  has No Known Allergies.  MEDICATIONS:  Current Outpatient Medications  Medication Sig Dispense Refill  . pantoprazole (PROTONIX) 40 MG tablet Take by mouth.    Marland Kitchen XIFAXAN 550 MG TABS tablet TAKE 1 TABLET (550 MG TOTAL) BY MOUTH 3 (THREE) TIMES DAILY FOR 14 DAYS     No current facility-administered medications for this visit.      PHYSICAL EXAMINATION: ECOG PERFORMANCE STATUS: 1 - Symptomatic but completely ambulatory Vitals:   06/23/19 1010  BP: (!) 118/54  Pulse: (!) 102  Resp: 18  Temp: 99.5 F (37.5 C)   Filed Weights   06/23/19 1009 06/23/19 1010  Weight: (!) 314 lb (142.4 kg) (!) 314 lb (142.4 kg)    Physical Exam Constitutional:      General: He is not in acute distress.    Appearance: He is obese.  HENT:  Head: Normocephalic and atraumatic.  Eyes:     General: No scleral icterus.    Pupils: Pupils are equal, round, and reactive to light.  Neck:     Musculoskeletal: Normal range of motion and neck supple.  Cardiovascular:     Rate and Rhythm: Normal rate and regular rhythm.     Heart sounds: Normal heart sounds.  Pulmonary:     Effort: Pulmonary effort is normal. No respiratory distress.     Breath sounds: No wheezing.  Abdominal:     General: Bowel sounds are normal. There is no distension.     Palpations: Abdomen is soft. There is no mass.     Tenderness: There is no abdominal tenderness.  Musculoskeletal: Normal range of motion.        General: No deformity.  Skin:    General: Skin is warm and dry.     Findings: No erythema or rash.  Neurological:      Mental Status: He is alert and oriented to person, place, and time.     Cranial Nerves: No cranial nerve deficit.     Coordination: Coordination normal.  Psychiatric:        Behavior: Behavior normal.        Thought Content: Thought content normal.     CMP Latest Ref Rng & Units 05/12/2019  Glucose 70 - 99 mg/dL 98  BUN 6 - 20 mg/dL 14  Creatinine 0.61 - 1.24 mg/dL 0.94  Sodium 135 - 145 mmol/L 139  Potassium 3.5 - 5.1 mmol/L 3.9  Chloride 98 - 111 mmol/L 105  CO2 22 - 32 mmol/L 25  Calcium 8.9 - 10.3 mg/dL 9.2  Total Protein 6.5 - 8.1 g/dL 8.2(H)  Total Bilirubin 0.3 - 1.2 mg/dL 0.6  Alkaline Phos 38 - 126 U/L 81  AST 15 - 41 U/L 20  ALT 0 - 44 U/L 25   CBC Latest Ref Rng & Units 05/12/2019  WBC 4.0 - 10.5 K/uL 11.1(H)  Hemoglobin 13.0 - 17.0 g/dL 15.2  Hematocrit 39.0 - 52.0 % 46.1  Platelets 150 - 400 K/uL 390     [place] Mesenteric Arteries  Result Date: 06/24/2019 CLINICAL DATA:  38 year old male with chronic abdominal pain, postprandial pain and decreasing weight EXAM: [place] MESENTERIC ARTERIAL DOPPLER COMPARISON:  None. FINDINGS: Celiac axis: 161 cm/sec Celiac axis with inspiration: 137 cm/sec Celiac axis with expiration: 175 cm/sec Splenic artery: Patent, highly tortuous likely resulting in spuriously high velocities. Hepatic artery: 191 cm/sec SMA: 200 cm/sec IMA: 147 cm/sec Aorta: 156 cm/sec Aortic size: 2.7 cm proximally, 2.6 cm in the mid segment and 2.0 cm distally IMPRESSION: No evidence of hemodynamically significant visceral artery stenosis. Signed, Criselda Peaches, MD, [facility] Radiology Electronically Signed   By: Jacqulynn Cadet M.D.   On: 06/24/2019 10:53    LABORATORY DATA:  I have reviewed the data as listed Lab Results  Component Value Date   WBC 11.1 (H) 05/12/2019   HGB 15.2 05/12/2019   HCT 46.1 05/12/2019   MCV 83.8 05/12/2019   PLT 390 05/12/2019   Recent Labs    05/12/19 1100  NA 139  K  3.9  CL 105  CO2 25  GLUCOSE 98  BUN 14  CREATININE 0.94  CALCIUM 9.2  GFRNONAA >60  GFRAA >60  PROT 8.2*  ALBUMIN 4.3  AST 20  ALT 25  ALKPHOS 81  BILITOT 0.6   Iron/TIBC/Ferritin/ %Sat    Component Value Date/Time  IRON 68 05/12/2019 1100   TIBC 360 05/12/2019 1100   FERRITIN 91 05/12/2019 1100   IRONPCTSAT 19 05/12/2019 1100     RADIOGRAPHIC STUDIES: I have personally reviewed the radiological images as listed and agreed with the findings in the report. 03/03/2019 CT abdomen pelvis with contrast  Showed a fat-containing right inguinal hernia.  No bowel containing hernia evident.  No demonstratable bowel obstruction.  No abscess in the abdomen or pelvis.   ASSESSMENT & PLAN:  1. Leukocytosis, unspecified type    #Labs reviewed and discussed with patient. CMP showed increased total protein level.  Consider obtaining multiple myeloma panel in the future. 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  No other significant diagnostic immunophenotypic abnormality detected. Positive for clonal T-cell receptor gene rearrangement  Discussed with patient that clonal T-cell receptor gamma rearrangement can be associated with lymphoma, T-cell or B-cell lymphoma, other possibility include transient clonal proliferation due to certain viral infections or inflammation. Patient have unexplained abdominal pain and diarrhea, unclear if the symptoms are related to the detection of T-cell receptor gene rearrangement.  Discussed with patient that we can watch and observe and repeat testing 6 weeks. Patient desires additional work-up right now which I think is also reasonable. We will proceed with bone marrow biopsy for further evaluation.  .   Orders Placed This Encounter  Procedures  . CT BONE MARROW BIOPSY & ASPIRATION    Standing Status:   Future    Standing Expiration Date:   09/21/2020    Order Specific Question:   Reason for Exam (SYMPTOM  OR DIAGNOSIS REQUIRED)     Answer:   leukocytosis; T cell receptor arrangement    Order Specific Question:   Preferred location?    Answer:   [facility]    Order Specific Question:   Radiology Contrast Protocol - do NOT remove file path    Answer:   [email or web address]    All questions were answered. The patient knows to call the clinic with any problems questions or concerns.  Return of visit: 1 week after bone marrow biopsy  Earlie Server, MD, PhD Hematology Oncology [facility] at [facility] Pager- [phone] 06/24/2019

## 2019-07-05 ENCOUNTER — Other Ambulatory Visit: Payer: Self-pay | Admitting: Radiology

## 2019-07-06 ENCOUNTER — Ambulatory Visit
Admission: RE | Admit: 2019-07-06 | Discharge: 2019-07-06 | Disposition: A | Discharge status: Home / Self Care | Payer: BC Managed Care – PPO | Source: Ambulatory Visit | Attending: Oncology | Admitting: Oncology

## 2019-07-06 ENCOUNTER — Ambulatory Visit: Payer: BC Managed Care – PPO

## 2019-07-06 ENCOUNTER — Other Ambulatory Visit: Payer: Self-pay

## 2019-07-06 DIAGNOSIS — Z79899 Other long term (current) drug therapy: Secondary | ICD-10-CM | POA: Insufficient documentation

## 2019-07-06 DIAGNOSIS — R1030 Lower abdominal pain, unspecified: Secondary | ICD-10-CM | POA: Insufficient documentation

## 2019-07-06 DIAGNOSIS — R197 Diarrhea, unspecified: Secondary | ICD-10-CM | POA: Insufficient documentation

## 2019-07-06 DIAGNOSIS — I10 Essential (primary) hypertension: Secondary | ICD-10-CM | POA: Insufficient documentation

## 2019-07-06 DIAGNOSIS — R5383 Other fatigue: Secondary | ICD-10-CM | POA: Insufficient documentation

## 2019-07-06 DIAGNOSIS — R110 Nausea: Secondary | ICD-10-CM | POA: Diagnosis not present

## 2019-07-06 DIAGNOSIS — D72829 Elevated white blood cell count, unspecified: Secondary | ICD-10-CM | POA: Insufficient documentation

## 2019-07-06 LAB — CBC WITH DIFFERENTIAL/PLATELET
Abs Immature Granulocytes: 0.04 10*3/uL (ref 0.00–0.07)
Basophils Absolute: 0.1 10*3/uL (ref 0.0–0.1)
Basophils Relative: 0 %
Eosinophils Absolute: 0.1 10*3/uL (ref 0.0–0.5)
Eosinophils Relative: 1 %
HCT: 45.8 % (ref 39.0–52.0)
Hemoglobin: 15.3 g/dL (ref 13.0–17.0)
Immature Granulocytes: 0 %
Lymphocytes Relative: 38 %
Lymphs Abs: 5.1 10*3/uL — ABNORMAL HIGH (ref 0.7–4.0)
MCH: 27.8 pg (ref 26.0–34.0)
MCHC: 33.4 g/dL (ref 30.0–36.0)
MCV: 83.3 fL (ref 80.0–100.0)
Monocytes Absolute: 1 10*3/uL (ref 0.1–1.0)
Monocytes Relative: 7 %
Neutro Abs: 7.1 10*3/uL (ref 1.7–7.7)
Neutrophils Relative %: 54 %
Platelets: 419 10*3/uL — ABNORMAL HIGH (ref 150–400)
RBC: 5.5 MIL/uL (ref 4.22–5.81)
RDW: 13.2 % (ref 11.5–15.5)
WBC: 13.4 10*3/uL — ABNORMAL HIGH (ref 4.0–10.5)
nRBC: 0 % (ref 0.0–0.2)

## 2019-07-06 LAB — PROTIME-INR
INR: 0.9 (ref 0.8–1.2)
Prothrombin Time: 12.5 seconds (ref 11.4–15.2)

## 2019-07-06 MED ORDER — FENTANYL CITRATE (PF) 100 MCG/2ML IJ SOLN
INTRAMUSCULAR | Status: AC
  Filled 2019-07-06: qty 4

## 2019-07-06 MED ORDER — FENTANYL CITRATE (PF) 100 MCG/2ML IJ SOLN
INTRAMUSCULAR | Status: AC | PRN
  Administered 2019-07-06 (×2): 50 ug via INTRAVENOUS

## 2019-07-06 MED ORDER — HEPARIN SOD (PORK) LOCK FLUSH 100 UNIT/ML IV SOLN
INTRAVENOUS | Status: AC
  Filled 2019-07-06: qty 5

## 2019-07-06 MED ORDER — MIDAZOLAM HCL 5 MG/5ML IJ SOLN
INTRAMUSCULAR | Status: AC
  Filled 2019-07-06: qty 5

## 2019-07-06 MED ORDER — MIDAZOLAM HCL 2 MG/2ML IJ SOLN
INTRAMUSCULAR | Status: AC | PRN
  Administered 2019-07-06: 1 mg via INTRAVENOUS
  Administered 2019-07-06: 2 mg via INTRAVENOUS
  Administered 2019-07-06 (×2): 1 mg via INTRAVENOUS

## 2019-07-06 MED ORDER — SODIUM CHLORIDE 0.9 % IV SOLN
INTRAVENOUS | Status: DC
  Administered 2019-07-06: 08:00:00 via INTRAVENOUS

## 2019-07-06 NOTE — H&P (Signed)
Chief Complaint: Patient was seen in consultation today for bone marrow biopsy at the request of Yu,Zhou  Referring Physician(s): Yu,Zhou  Patient Status: [facility] - Out-pt  History of Present Illness: Andrew Lozano. is a 38 y.o. male with recent right and lower abdominal pain associated with chronic diarrhea. Blood work has revealed leukocytosis and increased total protein. Flow cytometry was positive for clonal T-cell receptor gene rearrangement. He presents for bone marrow biopsy for further hematologic work up.  Past Medical History:  Diagnosis Date  . Hypertension     Past Surgical History:  Procedure Laterality Date  . BILATERAL CARPAL TUNNEL RELEASE    . CARPAL TUNNEL WITH CUBITAL TUNNEL      Allergies: Patient has no known allergies.  Medications: Prior to Admission medications   Medication Sig Start Date End Date Taking? Authorizing Provider  lisinopril (ZESTRIL) 40 MG tablet Take 40 mg by mouth daily.   Yes [provider]  pantoprazole (PROTONIX) 40 MG tablet Take by mouth. 02/01/19  Yes [provider]  XIFAXAN 550 MG TABS tablet TAKE 1 TABLET (550 MG TOTAL) BY MOUTH 3 (THREE) TIMES DAILY FOR 14 DAYS 05/14/19   [provider]     Family History  Problem Relation Age of Onset  . Lymphoma Maternal Aunt     Social History   Socioeconomic History  . Marital status: Married    Spouse name: Not on file  . Number of children: Not on file  . Years of education: Not on file  . Highest education level: Not on file  Occupational History  . Not on file  Social Needs  . Financial resource strain: Not on file  . Food insecurity    Worry: Not on file    Inability: Not on file  . Transportation needs    Medical: Not on file    Non-medical: Not on file  Tobacco Use  . Smoking status: Never Smoker  . Smokeless tobacco: Never Used  Substance and Sexual Activity  . Alcohol use: Never    Frequency: Never  . Drug use: Never  .  Sexual activity: Not on file  Lifestyle  . Physical activity    Days per week: Not on file    Minutes per session: Not on file  . Stress: Not on file  Relationships  . Social Herbalist on phone: Not on file    Gets together: Not on file    Attends religious service: Not on file    Active member of club or organization: Not on file    Attends meetings of clubs or organizations: Not on file    Relationship status: Not on file  Other Topics Concern  . Not on file  Social History Narrative  . Not on file    Review of Systems: A 12 point ROS discussed and pertinent positives are indicated in the HPI above.  All other systems are negative.  Review of Systems  Constitutional: Positive for fatigue.  Respiratory: Negative.   Cardiovascular: Negative.   Gastrointestinal: Positive for abdominal pain, diarrhea and nausea.  Genitourinary: Negative.   Musculoskeletal: Negative.   Neurological: Negative.     Vital Signs: BP 111/75 (BP Location: Right Arm)   Pulse 98   Temp 98.6 F (37 C) (Oral)   Resp (!) 21   Ht '5\' 11"'  (1.803 m)   Wt (!) 140.6 kg   SpO2 99%   BMI 43.24 kg/m   Physical Exam Constitutional:  General: He is not in acute distress.    Appearance: Normal appearance. He is obese. He is not ill-appearing or diaphoretic.  HENT:     Head: Normocephalic and atraumatic.  Cardiovascular:     Rate and Rhythm: Normal rate and regular rhythm.     Heart sounds: Normal heart sounds. No murmur. No friction rub. No gallop.   Pulmonary:     Effort: Pulmonary effort is normal. No respiratory distress.     Breath sounds: Normal breath sounds. No stridor. No wheezing, rhonchi or rales.  Abdominal:     General: Bowel sounds are normal. There is no distension.     Palpations: Abdomen is soft. There is no mass.     Tenderness: There is no abdominal tenderness. There is no guarding or rebound.     Hernia: No hernia is present.  Musculoskeletal:        General: No  swelling.  Skin:    General: Skin is warm and dry.  Neurological:     General: No focal deficit present.     Mental Status: He is alert and oriented to person, place, and time.     Imaging: [place] Mesenteric Arteries  Result Date: 06/24/2019 CLINICAL DATA:  38 year old male with chronic abdominal pain, postprandial pain and decreasing weight EXAM: [place] MESENTERIC ARTERIAL DOPPLER COMPARISON:  None. FINDINGS: Celiac axis: 161 cm/sec Celiac axis with inspiration: 137 cm/sec Celiac axis with expiration: 175 cm/sec Splenic artery: Patent, highly tortuous likely resulting in spuriously high velocities. Hepatic artery: 191 cm/sec SMA: 200 cm/sec IMA: 147 cm/sec Aorta: 156 cm/sec Aortic size: 2.7 cm proximally, 2.6 cm in the mid segment and 2.0 cm distally IMPRESSION: No evidence of hemodynamically significant visceral artery stenosis. Signed, Criselda Peaches, MD, [facility] Radiology Electronically Signed   By: Jacqulynn Cadet M.D.   On: 06/24/2019 10:53    Labs:  CBC: Recent Labs    05/12/19 1100 07/06/19 0743  WBC 11.1* 13.4*  HGB 15.2 15.3  HCT 46.1 45.8  PLT 390 419*    COAGS: Recent Labs    07/06/19 0743  INR 0.9    BMP: Recent Labs    05/12/19 1100  NA 139  K 3.9  CL 105  CO2 25  GLUCOSE 98  BUN 14  CALCIUM 9.2  CREATININE 0.94  GFRNONAA >60  GFRAA >60    LIVER FUNCTION TESTS: Recent Labs    05/12/19 1100  BILITOT 0.6  AST 20  ALT 25  ALKPHOS 81  PROT 8.2*  ALBUMIN 4.3    Assessment and Plan:  For CT guided bone marrow biopsy today. Risks and benefits of bone marrow biopsy was discussed with the patient and/or patient's family including, but not limited to bleeding, infection, damage to adjacent structures or low yield requiring additional tests. All of the questions were answered and there is agreement to proceed. Consent signed and in chart.  Thank you for this interesting consult.  I greatly  enjoyed meeting Andrew Lozano. and look forward to participating in their care.  A copy of this report was sent to the requesting provider on this date.  Electronically Signed: Azzie Roup, MD 07/06/2019, 9:05 AM     I spent a total of 15 Minutes in face to face in clinical consultation, greater than 50% of which was counseling/coordinating care for bone marrow biopsy.

## 2019-07-06 NOTE — Procedures (Signed)
Interventional Radiology Procedure Note  Procedure: CT guided bone marrow aspiration and biopsy  Complications: None  EBL: < 10 mL  Findings: Aspirate and core biopsy performed of bone marrow in right iliac bone.  Plan: Bedrest supine x 1 hrs  Kamdon Reisig T. Maleaha Hughett, M.D Pager:  [phone]   

## 2019-07-07 ENCOUNTER — Other Ambulatory Visit: Payer: Self-pay

## 2019-07-12 LAB — SURGICAL PATHOLOGY

## 2019-07-13 ENCOUNTER — Encounter (HOSPITAL_COMMUNITY): Payer: Self-pay | Admitting: Oncology

## 2019-07-13 ENCOUNTER — Other Ambulatory Visit: Payer: Self-pay | Admitting: Oncology

## 2019-07-13 ENCOUNTER — Telehealth: Payer: Self-pay

## 2019-07-13 DIAGNOSIS — D72829 Elevated white blood cell count, unspecified: Secondary | ICD-10-CM

## 2019-07-13 NOTE — Telephone Encounter (Signed)
Patient informed.  He will get his next appts thru [place].

## 2019-07-13 NOTE — Telephone Encounter (Signed)
-----  Message from Earlie Server, MD sent at 07/13/2019  1:26 PM EDT ----- Please let patient know that I have reviewed his bone marrow biopsy. No leukemia or lymphoma. Some subtle changes.  Recommend him to get additional blood work done and see in 2 weeks after blood work for further discussion.  . MD only. Thanks.

## 2019-07-20 ENCOUNTER — Inpatient Hospital Stay: Payer: BC Managed Care – PPO

## 2019-07-20 ENCOUNTER — Other Ambulatory Visit: Payer: Self-pay

## 2019-07-20 DIAGNOSIS — D72829 Elevated white blood cell count, unspecified: Secondary | ICD-10-CM | POA: Diagnosis not present

## 2019-07-20 LAB — CBC WITH DIFFERENTIAL/PLATELET
Abs Immature Granulocytes: 0.03 10*3/uL (ref 0.00–0.07)
Basophils Absolute: 0.1 10*3/uL (ref 0.0–0.1)
Basophils Relative: 0 %
Eosinophils Absolute: 0 10*3/uL (ref 0.0–0.5)
Eosinophils Relative: 0 %
HCT: 43.8 % (ref 39.0–52.0)
Hemoglobin: 14.8 g/dL (ref 13.0–17.0)
Immature Granulocytes: 0 %
Lymphocytes Relative: 33 %
Lymphs Abs: 4.3 10*3/uL — ABNORMAL HIGH (ref 0.7–4.0)
MCH: 27.7 pg (ref 26.0–34.0)
MCHC: 33.8 g/dL (ref 30.0–36.0)
MCV: 82 fL (ref 80.0–100.0)
Monocytes Absolute: 1 10*3/uL (ref 0.1–1.0)
Monocytes Relative: 8 %
Neutro Abs: 7.8 10*3/uL — ABNORMAL HIGH (ref 1.7–7.7)
Neutrophils Relative %: 59 %
Platelets: 385 10*3/uL (ref 150–400)
RBC: 5.34 MIL/uL (ref 4.22–5.81)
RDW: 13.4 % (ref 11.5–15.5)
WBC: 13.3 10*3/uL — ABNORMAL HIGH (ref 4.0–10.5)
nRBC: 0 % (ref 0.0–0.2)

## 2019-07-21 LAB — KAPPA/LAMBDA LIGHT CHAINS
Kappa free light chain: 14.9 mg/L (ref 3.3–19.4)
Kappa, lambda light chain ratio: 1.18 (ref 0.26–1.65)
Lambda free light chains: 12.6 mg/L (ref 5.7–26.3)

## 2019-07-22 LAB — MULTIPLE MYELOMA PANEL, SERUM
Albumin SerPl Elph-Mcnc: 3.8 g/dL (ref 2.9–4.4)
Albumin/Glob SerPl: 1.2 (ref 0.7–1.7)
Alpha 1: 0.2 g/dL (ref 0.0–0.4)
Alpha2 Glob SerPl Elph-Mcnc: 1 g/dL (ref 0.4–1.0)
B-Globulin SerPl Elph-Mcnc: 0.9 g/dL (ref 0.7–1.3)
Gamma Glob SerPl Elph-Mcnc: 1.2 g/dL (ref 0.4–1.8)
Globulin, Total: 3.3 g/dL (ref 2.2–3.9)
IgA: 192 mg/dL (ref 90–386)
IgG (Immunoglobin G), Serum: 1285 mg/dL (ref 603–1613)
IgM (Immunoglobulin M), Srm: 139 mg/dL (ref 20–172)
Total Protein ELP: 7.1 g/dL (ref 6.0–8.5)

## 2019-07-26 ENCOUNTER — Encounter: Payer: Self-pay | Admitting: Oncology

## 2019-07-26 ENCOUNTER — Other Ambulatory Visit: Payer: Self-pay

## 2019-07-26 NOTE — Progress Notes (Signed)
Patient stated that he has had trouble sleeping and that his appetite is poor at times. Patient stated that his weight fluctuates from 190-210 lbs. Patient also stated that his abdomen is constantly hurting. Patient denied fever, chills, nausea or vomiting.

## 2019-07-27 ENCOUNTER — Other Ambulatory Visit: Payer: Self-pay

## 2019-07-27 ENCOUNTER — Inpatient Hospital Stay: Payer: BC Managed Care – PPO | Attending: Oncology | Admitting: Oncology

## 2019-07-27 VITALS — BP 143/89 | HR 106 | Temp 98.5°F | Resp 18 | Wt 315.5 lb

## 2019-07-27 DIAGNOSIS — I10 Essential (primary) hypertension: Secondary | ICD-10-CM | POA: Insufficient documentation

## 2019-07-27 DIAGNOSIS — K589 Irritable bowel syndrome without diarrhea: Secondary | ICD-10-CM | POA: Insufficient documentation

## 2019-07-27 DIAGNOSIS — Z79899 Other long term (current) drug therapy: Secondary | ICD-10-CM | POA: Insufficient documentation

## 2019-07-27 DIAGNOSIS — D72829 Elevated white blood cell count, unspecified: Secondary | ICD-10-CM | POA: Insufficient documentation

## 2019-07-27 NOTE — Progress Notes (Signed)
Patient still has pending labs from last visit.  He would still like to see Dr. Tasia Catchings today to discuss liver.

## 2019-07-28 NOTE — Progress Notes (Signed)
Hematology/Oncology  [facility] Telephone:([phone] [phone]   Patient Care Team: Ricardo Jericho, NP as PCP - General (Family Medicine)  [facility] Madolyn [facility]rieze  CHIEF COMPLAINTS/REASON FOR VISIT:  Evaluation of leukocytosis  HISTORY OF PRESENTING ILLNESS:  Andrew Arnott. is a  38 y.o.  male with PMH listed below was seen at the request of Dr. Madolyn Frieze for consultation of leukocytosis. Patient's care has been with [facility]  Extensive medical records review was performed via care everywhere Reviewed patient' recent labs  04/19/2019 WBC 11.1, hemoglobin 14.4, MCV 85.6, platelet 375. Differential low eosinophil percentage. Vitamin D level 25.4 03/19/2019, chemistry showed sodium 142, potassium 4.3, calcium 9.1, bilirubin 0.4, alkaline phosphatase 77, AST 24, ALT 30. TSH 1.695 Vitamin B12 level 160. CBC showed elevated white count ofPrevious lab records reviewed. Leukocytosis onset of chronic, duration is since  ESR 39 03/14/2015 HIV negative. She has a history of increased total protein.  03/11/2016 serum protein electrophoresis showed no monoclonal component identified. 03/11/2016 ANA positive.    No aggravating or elevated factors. Associat he had ed symptoms or signs:  Denies weight loss, fever, chills,night sweats.  Fatigue Smoking history: Never smoker. History of recent oral steroid use or steroid injection: Denies History of recent infection: Denies Autoimmune disease history.  Denies  Patient complains generalized abdominal pain and diarrhea.  He has colonoscopy performed on 03/28/2019 with Dr. Alice Reichert showing examined portion of the ileum normal, entire examined colon normal and pathology showing terminal ileum biopsies with no diagnostic abnormalities no evidence of idiopathic inflammatory bowel disease, right colon left  colon rectal biopsies negative for any active or specific colitis. Diarrhea with average 4 bowel movements daily, watery. Patient currently is on short-term disability.  He mentioned that he used to work for [employer] department and potentially be exposed to toxins.  He mentioned 1 of his colleagues was having similar symptoms end up being diagnosed with pancreatic cancer. Working diagnosis is IBS.  Patient is being referred out for tertiary center for further work-up of possible bacterial overgrowth. Patient has multiple complaints today.  Also have chronic pelvic and thigh pain  Reports appetite is not very good.  Weight is 313 pounds, he weighed 313 pounds on 02/01/2019 at Dr. Ricky Stabs office.  No significant weight loss during past 3 months. He weighed 323 pounds on 03/23/2018.   # CBC showed white count 11.1, complete normal differential. Smear showed lymphocyte very in size with small and large cells noted and  reactive lymphocytes noted. Normal B12 level at 580 Normal LDH and uric acid. Iron panel nonremarkable.  No deficiency overload. C-reactive protein and ANA with reflex negative   INTERVAL HISTORY Andrew Lozano. is a 38 y.o. male who has above history reviewed by me today presents for follow up visit for leukocytosis.  Problems and complaints are listed below: Patient presents to discuss lab work-up results and discussion of bone marrow biopsy results.  His weight fluctuates 190-210 Ibs. Still have intermittent abdominal pain, no exacerbated or alleviated factors.  #Patient had [place] mesenteric arteries which did not show any significant visceral artery stenosis. He was recommended to go to [place] [facility] for further evaluation in their functional GI clinic.   Review of Systems  Constitutional: Positive for fatigue. Negative for appetite change, chills, fever and unexpected weight change.  HENT:   Negative for hearing loss and voice change.   Eyes: Negative  for eye problems and icterus.   Respiratory: Negative for chest tightness, cough and shortness of breath.   Cardiovascular: Negative for chest pain and leg swelling.  Gastrointestinal: Positive for abdominal pain and diarrhea. Negative for abdominal distention.  Endocrine: Negative for hot flashes.  Genitourinary: Negative for difficulty urinating, dysuria and frequency.   Musculoskeletal: Negative for arthralgias.  Skin: Negative for itching and rash.  Neurological: Negative for light-headedness and numbness.  Hematological: Negative for adenopathy. Does not bruise/bleed easily.  Psychiatric/Behavioral: Negative for confusion.     MEDICAL HISTORY:  Past Medical History:  Diagnosis Date  . Hypertension     SURGICAL HISTORY: Past Surgical History:  Procedure Laterality Date  . BILATERAL CARPAL TUNNEL RELEASE    . CARPAL TUNNEL WITH CUBITAL TUNNEL      SOCIAL HISTORY: Social History   Socioeconomic History  . Marital status: Married    Spouse name: Not on file  . Number of children: Not on file  . Years of education: Not on file  . Highest education level: Not on file  Occupational History  . Not on file  Social Needs  . Financial resource strain: Not on file  . Food insecurity    Worry: Not on file    Inability: Not on file  . Transportation needs    Medical: Not on file    Non-medical: Not on file  Tobacco Use  . Smoking status: Never Smoker  . Smokeless tobacco: Never Used  Substance and Sexual Activity  . Alcohol use: Never    Frequency: Never  . Drug use: Never  . Sexual activity: Not on file  Lifestyle  . Physical activity    Days per week: Not on file    Minutes per session: Not on file  . Stress: Not on file  Relationships  . Social Herbalist on phone: Not on file    Gets together: Not on file    Attends religious service: Not on file    Active member of club or organization: Not on file    Attends meetings of clubs or organizations: Not on file    Relationship  status: Not on file  . Intimate partner violence    Fear of current or ex partner: Not on file    Emotionally abused: Not on file    Physically abused: Not on file    Forced sexual activity: Not on file  Other Topics Concern  . Not on file  Social History Narrative  . Not on file    FAMILY HISTORY: Family History  Problem Relation Age of Onset  . Lymphoma Maternal Aunt     ALLERGIES:  has No Known Allergies.  MEDICATIONS:  Current Outpatient Medications  Medication Sig Dispense Refill  . pantoprazole (PROTONIX) 40 MG tablet Take by mouth.    Marland Kitchen lisinopril (ZESTRIL) 40 MG tablet Take 40 mg by mouth daily.     No current facility-administered medications for this visit.      PHYSICAL EXAMINATION: ECOG PERFORMANCE STATUS: 1 - Symptomatic but completely ambulatory Vitals:   07/27/19 1323  BP: (!) 143/89  Pulse: (!) 106  Resp: 18  Temp: 98.5 F (36.9 C)   Filed Weights   07/27/19 1323  Weight: (!) 315 lb 8 oz (143.1 kg)    Physical Exam Constitutional:      General: He is not in acute distress.    Appearance: He is obese.  HENT:     Head: Normocephalic and atraumatic.  Eyes:     General: No scleral icterus.    Pupils: Pupils are equal, round, and reactive to light.  Neck:     Musculoskeletal: Normal range of motion and neck supple.  Cardiovascular:     Rate and Rhythm: Normal rate and regular rhythm.     Heart sounds: Normal heart sounds.  Pulmonary:     Effort: Pulmonary effort is normal. No respiratory distress.     Breath sounds: No wheezing.  Abdominal:     General: Bowel sounds are normal. There is no distension.     Palpations: Abdomen is soft. There is no mass.     Tenderness: There is no abdominal tenderness.  Musculoskeletal: Normal range of motion.        General: No deformity.  Skin:    General: Skin is warm and dry.     Findings: No erythema or rash.  Neurological:     Mental Status: He is alert and oriented to person, place, and time.      Cranial Nerves: No cranial nerve deficit.     Coordination: Coordination normal.  Psychiatric:        Behavior: Behavior normal.        Thought Content: Thought content normal.     CMP Latest Ref Rng & Units 05/12/2019  Glucose 70 - 99 mg/dL 98  BUN 6 - 20 mg/dL 14  Creatinine 0.61 - 1.24 mg/dL 0.94  Sodium 135 - 145 mmol/L 139  Potassium 3.5 - 5.1 mmol/L 3.9  Chloride 98 - 111 mmol/L 105  CO2 22 - 32 mmol/L 25  Calcium 8.9 - 10.3 mg/dL 9.2  Total Protein 6.5 - 8.1 g/dL 8.2(H)  Total Bilirubin 0.3 - 1.2 mg/dL 0.6  Alkaline Phos 38 - 126 U/L 81  AST 15 - 41 U/L 20  ALT 0 - 44 U/L 25   CBC Latest Ref Rng & Units 07/20/2019  WBC 4.0 - 10.5 K/uL 13.3(H)  Hemoglobin 13.0 - 17.0 g/dL 14.8  Hematocrit 39.0 - 52.0 % 43.8  Platelets 150 - 400 K/uL 385     Ct Bone Marrow Biopsy & Aspiration  Result Date: 07/06/2019 CLINICAL DATA:  Leukocytosis and T-cell receptor arrangement. Need for bone marrow biopsy for further workup. EXAM: CT GUIDED BONE MARROW ASPIRATION AND BIOPSY ANESTHESIA/SEDATION: Versed 0.5 mg IV, Fentanyl 100 mcg IV Total Moderate Sedation Time:   25 minutes. The patient's level of consciousness and physiologic status were continuously monitored during the procedure by Radiology nursing. PROCEDURE: The procedure risks, benefits, and alternatives were explained to the patient. Questions regarding the procedure were encouraged and answered. The patient understands and consents to the procedure. A time out was performed prior to initiating the procedure. The right gluteal region was prepped with chlorhexidine. Sterile gown and sterile gloves were used for the procedure. Local anesthesia was provided with 1% Lidocaine. Under CT guidance, an 11 gauge On Control bone cutting needle was advanced from a posterior approach into the right iliac bone. Needle positioning was confirmed with CT. Initial non heparinized and heparinized aspirate samples were obtained of bone marrow. Core biopsy  was performed via the On Control drill needle. COMPLICATIONS: None FINDINGS: Inspection of initial aspirate did reveal visible particles. Intact core biopsy sample was obtained. IMPRESSION: CT guided bone marrow biopsy of right posterior iliac bone with both aspirate and core samples obtained. Electronically Signed   By: Aletta Edouard M.D.   On: 07/06/2019 11:41    LABORATORY DATA:  I have reviewed the  data as listed Lab Results  Component Value Date   WBC 13.3 (H) 07/20/2019   HGB 14.8 07/20/2019   HCT 43.8 07/20/2019   MCV 82.0 07/20/2019   PLT 385 07/20/2019   Recent Labs    05/12/19 1100  NA 139  K 3.9  CL 105  CO2 25  GLUCOSE 98  BUN 14  CREATININE 0.94  CALCIUM 9.2  GFRNONAA >60  GFRAA >60  PROT 8.2*  ALBUMIN 4.3  AST 20  ALT 25  ALKPHOS 81  BILITOT 0.6   Iron/TIBC/Ferritin/ %Sat    Component Value Date/Time   IRON 68 05/12/2019 1100   TIBC 360 05/12/2019 1100   FERRITIN 91 05/12/2019 1100   IRONPCTSAT 19 05/12/2019 1100     RADIOGRAPHIC STUDIES: I have personally reviewed the radiological images as listed and agreed with the findings in the report. 03/03/2019 CT abdomen pelvis with contrast  Showed a fat-containing right inguinal hernia.  No bowel containing hernia evident.  No demonstratable bowel obstruction.  No abscess in the abdomen or pelvis.   ASSESSMENT & PLAN:  1. Leukocytosis, unspecified type   # Labs are reviewed.  Leukocytosis, likely reactive due to chronic inflammation, obesity.  Positive clonal T cell receptor gene rearrangement can be seen in inflammatory process.  Bone marrow showed normo-cellular marrow with megakaryocytic hyperplasia and atypia, Cytogenetics showed normal Karyotype.  Non specific finding, additional work up showed negative multiple myeloma work up, Jak2 V617F with reflex and BCL ABL FISH tests are still pending.  I discussed with patient that if these pending tests are negative, no need for additional work up from  hematology standpoint. I recommend patient to continue follow up with GI Patient prefers to do a follow up visit in 6 months. .   Orders Placed This Encounter  Procedures  . CBC with Differential/Platelet    Standing Status:   Future    Standing Expiration Date:   07/26/2020    All questions were answered. The patient knows to call the clinic with any problems questions or concerns.  Return of visit: 6 months.   Earlie Server, MD, PhD Hematology Oncology [facility] Pager- [phone] 07/28/2019

## 2019-08-01 LAB — BCR-ABL1 FISH
Cells Analyzed: 200
Cells Counted: 200

## 2019-08-02 LAB — CALR + JAK2 E12-15 + MPL (REFLEXED)

## 2019-08-02 LAB — JAK2 V617F, W REFLEX TO CALR/E12/MPL

## 2019-09-29 ENCOUNTER — Telehealth: Payer: Self-pay

## 2019-09-29 NOTE — Telephone Encounter (Signed)
Received a form from [facility] for Dr. Tasia Catchings to complete.  MD advised at list visit on 07/27/2019 that there is no need for additional work up from hematology standpoint and f/u in 6 months.  Left a message with claim specialists and also faxed back stating that this "repuest not appropriate for our office to complete".

## 2019-12-13 ENCOUNTER — Telehealth: Payer: Self-pay

## 2019-12-13 NOTE — Telephone Encounter (Signed)
Received form from [employer] regaring long term disability benefits. Per Dr. Bethanne Ginger last OV note on 10/7: no additional work up form hematology standpoint & follow up in 6 months. Pt recommended to follow up with GI. Form faxed back to [facility] as request is not approprite four our office.

## 2020-01-23 ENCOUNTER — Other Ambulatory Visit: Payer: Self-pay

## 2020-01-23 ENCOUNTER — Inpatient Hospital Stay: Payer: BC Managed Care – PPO

## 2020-01-23 ENCOUNTER — Inpatient Hospital Stay: Payer: BC Managed Care – PPO | Admitting: Oncology

## 2020-01-23 DIAGNOSIS — I10 Essential (primary) hypertension: Secondary | ICD-10-CM | POA: Insufficient documentation

## 2020-01-23 DIAGNOSIS — D72829 Elevated white blood cell count, unspecified: Secondary | ICD-10-CM | POA: Insufficient documentation

## 2020-01-23 DIAGNOSIS — Z79899 Other long term (current) drug therapy: Secondary | ICD-10-CM | POA: Insufficient documentation

## 2020-01-23 DIAGNOSIS — K529 Noninfective gastroenteritis and colitis, unspecified: Secondary | ICD-10-CM | POA: Insufficient documentation

## 2020-01-23 LAB — CBC WITH DIFFERENTIAL/PLATELET
Abs Immature Granulocytes: 0.03 10*3/uL (ref 0.00–0.07)
Basophils Absolute: 0 10*3/uL (ref 0.0–0.1)
Basophils Relative: 0 %
Eosinophils Absolute: 0.1 10*3/uL (ref 0.0–0.5)
Eosinophils Relative: 1 %
HCT: 42.6 % (ref 39.0–52.0)
Hemoglobin: 14.4 g/dL (ref 13.0–17.0)
Immature Granulocytes: 0 %
Lymphocytes Relative: 37 %
Lymphs Abs: 4 10*3/uL (ref 0.7–4.0)
MCH: 28 pg (ref 26.0–34.0)
MCHC: 33.8 g/dL (ref 30.0–36.0)
MCV: 82.9 fL (ref 80.0–100.0)
Monocytes Absolute: 0.9 10*3/uL (ref 0.1–1.0)
Monocytes Relative: 8 %
Neutro Abs: 5.9 10*3/uL (ref 1.7–7.7)
Neutrophils Relative %: 54 %
Platelets: 374 10*3/uL (ref 150–400)
RBC: 5.14 MIL/uL (ref 4.22–5.81)
RDW: 13 % (ref 11.5–15.5)
WBC: 11 10*3/uL — ABNORMAL HIGH (ref 4.0–10.5)
nRBC: 0 % (ref 0.0–0.2)

## 2020-01-24 ENCOUNTER — Encounter: Payer: Self-pay | Admitting: Oncology

## 2020-01-24 ENCOUNTER — Other Ambulatory Visit: Payer: Self-pay

## 2020-01-24 ENCOUNTER — Inpatient Hospital Stay: Payer: BC Managed Care – PPO | Attending: Oncology | Admitting: Oncology

## 2020-01-24 DIAGNOSIS — D72829 Elevated white blood cell count, unspecified: Secondary | ICD-10-CM | POA: Diagnosis not present

## 2020-01-24 NOTE — Progress Notes (Signed)
HEMATOLOGY-ONCOLOGY TeleHEALTH VISIT PROGRESS NOTE  I connected with Andrew Lozano. on 01/24/20 at  2:45 PM EDT by video enabled telemedicine visit and verified that I am speaking with the correct person using two identifiers. I discussed the limitations, risks, security and privacy concerns of performing an evaluation and management service by telemedicine and the availability of in-person appointments. I also discussed with the patient that there may be a patient responsible charge related to this service. The patient expressed understanding and agreed to proceed.   Other persons participating in the visit and their role in the encounter:  None  Patient's location: Home  Provider's location: office Chief Complaint: leukocytosis     INTERVAL HISTORY Andrew Lozano. is a 39 y.o. male who has above history reviewed by me today presents for follow up visit for leukocytosis  Problems and complaints are listed below:  He established care with [facility] for chronic diarrhea. GI was considering capsule study on low probability of Crohn's disease. GI also recommend another trial of empiric treatment for bacterial overgrowth. He is on Amitriptyline 62m at bedtime. He also took a course of antibiotics.  He also has additional blood work done at [facility]  ANA +,  anti parietal cell antibody, RF, anti LA, Anti RO, anti RNP, anti SM. Intrinsic factor, CRP WNL Normal B12.  He feels that his diarrhea and abdominal pain symptoms have improved recently.  Denies weight loss, fever, chills, fatigue, night sweats.    Review of Systems  Constitutional: Positive for fatigue. Negative for appetite change, chills, fever and unexpected weight change.  HENT:   Negative for hearing loss and voice change.   Eyes: Negative for eye problems and icterus.  Respiratory: Negative for chest tightness, cough and shortness of breath.   Cardiovascular: Negative for chest pain and leg swelling.  Gastrointestinal:  Positive for abdominal pain and diarrhea. Negative for abdominal distention.  Endocrine: Negative for hot flashes.  Genitourinary: Negative for difficulty urinating, dysuria and frequency.   Musculoskeletal: Negative for arthralgias.  Skin: Negative for itching and rash.  Neurological: Negative for light-headedness and numbness.  Hematological: Negative for adenopathy. Does not bruise/bleed easily.  Psychiatric/Behavioral: Negative for confusion.    Past Medical History:  Diagnosis Date  . Hypertension    Past Surgical History:  Procedure Laterality Date  . BILATERAL CARPAL TUNNEL RELEASE    . CARPAL TUNNEL WITH CUBITAL TUNNEL      Family History  Problem Relation Age of Onset  . Lymphoma Maternal Aunt     Social History   Socioeconomic History  . Marital status: Married    Spouse name: Not on file  . Number of children: Not on file  . Years of education: Not on file  . Highest education level: Not on file  Occupational History  . Not on file  Tobacco Use  . Smoking status: Never Smoker  . Smokeless tobacco: Never Used  Substance and Sexual Activity  . Alcohol use: Never  . Drug use: Never  . Sexual activity: Not on file  Other Topics Concern  . Not on file  Social History Narrative  . Not on file   Social Determinants of Health   Financial Resource Strain:   . Difficulty of Paying Living Expenses:   Food Insecurity:   . Worried About RCharity fundraiserin the Last Year:   . RArboriculturistin the Last Year:   Transportation Needs:   . LFilm/video editor(Medical):   .Marland Kitchen  Lack of Transportation (Non-Medical):   Physical Activity:   . Days of Exercise per Week:   . Minutes of Exercise per Session:   Stress:   . Feeling of Stress :   Social Connections:   . Frequency of Communication with Friends and Family:   . Frequency of Social Gatherings with Friends and Family:   . Attends Religious Services:   . Active Member of Clubs or Organizations:   .  Attends Club or Organization Meetings:   . Marital Status:   Intimate Partner Violence:   . Fear of Current or Ex-Partner:   . Emotionally Abused:   . Physically Abused:   . Sexually Abused:     Current Outpatient Medications on File Prior to Visit  Medication Sig Dispense Refill  . amitriptyline (ELAVIL) 75 MG tablet Take by mouth.    . colesevelam (WELCHOL) 625 MG tablet     . pantoprazole (PROTONIX) 40 MG tablet Take by mouth.    . lisinopril (ZESTRIL) 40 MG tablet Take 40 mg by mouth daily.     No current facility-administered medications on file prior to visit.    No Known Allergies     Observations/Objective: Today's Vitals   01/24/20 1328  PainSc: 0-No pain   There is no height or weight on file to calculate BMI.  Physical Exam  Constitutional: No distress.  Neurological: He is alert.    CBC    Component Value Date/Time   WBC 11.0 (H) 01/23/2020 1307   RBC 5.14 01/23/2020 1307   HGB 14.4 01/23/2020 1307   HCT 42.6 01/23/2020 1307   PLT 374 01/23/2020 1307   MCV 82.9 01/23/2020 1307   MCH 28.0 01/23/2020 1307   MCHC 33.8 01/23/2020 1307   RDW 13.0 01/23/2020 1307   LYMPHSABS 4.0 01/23/2020 1307   MONOABS 0.9 01/23/2020 1307   EOSABS 0.1 01/23/2020 1307   BASOSABS 0.0 01/23/2020 1307    CMP     Component Value Date/Time   NA 139 05/12/2019 1100   K 3.9 05/12/2019 1100   CL 105 05/12/2019 1100   CO2 25 05/12/2019 1100   GLUCOSE 98 05/12/2019 1100   BUN 14 05/12/2019 1100   CREATININE 0.94 05/12/2019 1100   CALCIUM 9.2 05/12/2019 1100   PROT 8.2 (H) 05/12/2019 1100   ALBUMIN 4.3 05/12/2019 1100   AST 20 05/12/2019 1100   ALT 25 05/12/2019 1100   ALKPHOS 81 05/12/2019 1100   BILITOT 0.6 05/12/2019 1100   GFRNONAA >60 05/12/2019 1100   GFRAA >60 05/12/2019 1100     Assessment and Plan: 1. Leukocytosis, unspecified type     Labs are reviewed and discussed with patient. Chronic leukocytosis, likely due to chronic inflammation/obesity, reactive  LGL.  Peripheral blood flowcytometry showed CD7 negative T cell phenotype. Peripheral blood showed increased large granulocytes.  + clonal T cell receptor gene rearrangement can be seen in inflammation condition, No neutropenia, anemia, constitutional symptoms. No splenomegaly, hepatomegaly.  Bone marrow biopsy was done, no revealing leukocytosis etiology. Normal cytogenetics.  negative multiple myeloma work up, negative BCL ABL FISH, JAK2 V617F mutation negative, with reflex to other mutations CALR, MPL, JAK 2 Ex 12-15 mutations negative.  He has positive ANA, indicating underlying autoimmune inflammatory condition. He is getting worked up by GI for chronic diarrhea.  I recommend hold off additional work up for his leukocytosis at this point.  Patient would like to obtain a second opinion at [facility] and I will refer.    Follow Up   Instructions: No follow up needed.    I discussed the assessment and treatment plan with the patient. The patient was provided an opportunity to ask questions and all were answered. The patient agreed with the plan and demonstrated an understanding of the instructions.  The patient was advised to call back or seek an in-person evaluation if the symptoms worsen or if the condition fails to improve as anticipated.    Earlie Server, MD 01/24/2020 5:47 PM

## 2020-01-24 NOTE — Progress Notes (Signed)
Patient verified using two identifiers for virtual visit via telephone today.  Patient does not offer any problems today.  

## 2020-11-16 ENCOUNTER — Other Ambulatory Visit: Payer: Self-pay

## 2020-11-16 ENCOUNTER — Ambulatory Visit
Admission: RE | Admit: 2020-11-16 | Discharge: 2020-11-16 | Disposition: A | Discharge status: Home / Self Care | Payer: BC Managed Care – PPO | Source: Ambulatory Visit | Attending: Family Medicine | Admitting: Family Medicine

## 2020-11-16 ENCOUNTER — Ambulatory Visit
Admission: EM | Admit: 2020-11-16 | Discharge: 2020-11-16 | Disposition: A | Discharge status: Home / Self Care | Payer: BC Managed Care – PPO | Attending: Family Medicine | Admitting: Family Medicine

## 2020-11-16 DIAGNOSIS — M79662 Pain in left lower leg: Secondary | ICD-10-CM | POA: Diagnosis not present

## 2020-11-16 DIAGNOSIS — R600 Localized edema: Secondary | ICD-10-CM | POA: Insufficient documentation

## 2020-11-16 NOTE — ED Triage Notes (Signed)
Patient states that he was admitted to the hospital for covid last month for 6 days. States that last night he started to have to left lower leg pain and calf swelling with shooting pain, states that he is concerned for DVT.

## 2020-11-16 NOTE — ED Provider Notes (Signed)
[facility]    CSN: [id number] Arrival date & time: 11/16/20  1054      History   Chief Complaint Chief Complaint  Patient presents with  . Leg Pain   HPI  40 year old male presents with the above complaint.  Patient states that 3 days ago he developed a burning sensation in both of his calves.  2 days ago he noticed left calf pain.  Yesterday his left calf was swollen.  He was mid to the hospital last month for COVID-19.  He was told to be on the look out for possible DVT.  Patient reports that he is having pain of the lower extremities.  Aching.  He is most concerned that he may have a DVT.  Some baseline shortness of breath.  No other reported symptoms.  No other complaints.  Past Medical History:  Diagnosis Date  . Hypertension     Patient Active Problem List   Diagnosis Date Noted  . Other fatigue 05/13/2019  . Leukocytosis 05/13/2019  . Diarrhea 05/13/2019  . Pain in joint involving pelvic region and thigh 05/13/2019  . 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 Microhematuria 05/22/2016  . Left wrist pain 05/11/2016  . Right lower quadrant abdominal pain 05/08/2016  . Urinary urgency 05/08/2016  . Headache disorder 10/24/2015  . Numbness and tingling 08/16/2015  . Pain of upper extremity 08/16/2015  . Essential hypertension 08/01/2015  . Frequency of micturition 06/14/2015  . Carpal tunnel syndrome, bilateral upper limbs 08/29/2014  . Morbid obesity (HCC) 07/28/2013    Past Surgical History:  Procedure Laterality Date  . BILATERAL CARPAL TUNNEL RELEASE    . CARPAL TUNNEL WITH CUBITAL TUNNEL         Home Medications    Prior to Admission medications   Medication Sig Start Date End Date Taking? Authorizing Provider  amitriptyline (ELAVIL) 75 MG tablet Take by mouth. 01/11/20 01/10/21 Yes [provider]  colesevelam (WELCHOL) 625 MG tablet  12/04/19   [provider]  lisinopril (ZESTRIL) 40 MG tablet Take 40 mg by mouth daily.     [provider]  pantoprazole (PROTONIX) 40 MG tablet Take by mouth. 02/01/19   [provider]    Family History Family History  Problem Relation Age of Onset  . Lymphoma Maternal Aunt     Social History Social History   Tobacco Use  . Smoking status: Never Smoker  . Smokeless tobacco: Never Used  Vaping Use  . Vaping Use: Never used  Substance Use Topics  . Alcohol use: Never  . Drug use: Never     Allergies   Patient has no known allergies.   Review of Systems Review of Systems  Respiratory: Positive for shortness of breath.   Musculoskeletal:       Left calf swelling.    Physical Exam Triage Vital Signs ED Triage Vitals  Enc Vitals Group     BP 11/16/20 1110 (!) 153/94     Pulse Rate 11/16/20 1110 (!) 115     Resp 11/16/20 1110 18     Temp 11/16/20 1110 98.3 F (36.8 C)     Temp Source 11/16/20 1110 Oral     SpO2 11/16/20 1110 100 %     Weight 11/16/20 1108 (!) 315 lb (142.9 kg)     Height 11/16/20 1108 6' (1.829 m)     Head Circumference --      Peak Flow --      Pain Score 11/16/20 1108  9     Pain Loc --      Pain Edu? --      Excl. in GC? --    Updated Vital Signs BP (!) 153/94 (BP Location: Left Arm)   Pulse (!) 115   Temp 98.3 F (36.8 C) (Oral)   Resp 18   Ht 6' (1.829 m)   Wt (!) 142.9 kg   SpO2 100%   BMI 42.72 kg/m   Visual Acuity Right Eye Distance:   Left Eye Distance:   Bilateral Distance:    Right Eye Near:   Left Eye Near:    Bilateral Near:     Physical Exam Vitals and nursing note reviewed.  Constitutional:      General: He is not in acute distress.    Appearance: He is obese. He is not ill-appearing.  HENT:     Head: Normocephalic and atraumatic.  Eyes:     General:        Right eye: No discharge.        Left eye: No discharge.     Conjunctiva/sclera: Conjunctivae normal.  Cardiovascular:     Rate and Rhythm: Regular rhythm. Tachycardia present.  Pulmonary:     Effort: Pulmonary effort is  normal.     Breath sounds: Normal breath sounds. No wheezing or rales.  Musculoskeletal:     Comments: Left calf swelling noted.  1.5 cm larger than the right calf.  Mild tenderness to palpation.  No redness.  Negative Homans' sign.  Neurological:     Mental Status: He is alert.  Psychiatric:        Mood and Affect: Mood normal.        Behavior: Behavior normal.      UC Treatments / Results  Labs (all labs ordered are listed, but only abnormal results are displayed) Labs Reviewed - No data to display  EKG   Radiology US Venous Img Lower Unilateral Left (DVT)  Result Date: 11/16/2020 CLINICAL DATA:  Left calf pain and swelling. Recent hospitalization. EXAM: LEFT LOWER EXTREMITY VENOUS DOPPLER ULTRASOUND TECHNIQUE: Gray-scale sonography with compression, as well as color and duplex ultrasound, were performed to evaluate the deep venous system(s) from the level of the common femoral vein through the popliteal and proximal calf veins. COMPARISON:  None. FINDINGS: VENOUS Normal compressibility of the common femoral, superficial femoral, and popliteal veins, as well as the visualized calf veins. Visualized portions of profunda femoral vein and great saphenous vein unremarkable. No filling defects to suggest DVT on grayscale or color Doppler imaging. Doppler waveforms show normal direction of venous flow, normal respiratory plasticity and response to augmentation. Limited views of the contralateral common femoral vein are unremarkable. OTHER None. Limitations: None. IMPRESSION: Negative. Electronically Signed   By: Obie Dredge M.D.   On: 11/16/2020 15:01    Procedures Procedures (including critical care time)  Medications Ordered in UC Medications - No data to display  Initial Impression / Assessment and Plan / UC Course  I have reviewed the triage vital signs and the nursing notes.  Pertinent labs & imaging results that were available during my care of the patient were reviewed by  me and considered in my medical decision making (see chart for details).    40 year old male presents with left calf pain and swelling.  Ultrasound was obtained and was negative for DVT.  Advised supportive care and elevation.  Final Clinical Impressions(s) / UC Diagnoses   Final diagnoses:  Pain of left calf  Discharge Instructions     I will call with the results.  Take care  Dr. Adriana Simas    ED Prescriptions    None     PDMP not reviewed this encounter.   Tommie Sams, [place] 11/16/20 1805

## 2020-11-16 NOTE — Discharge Instructions (Signed)
I will call with the results.  Take care  Dr. Sofya Moustafa  

## 2021-08-07 IMAGING — CT CT BIOPSY AND ASPIRATION BONE MARROW
1 of 2 series · 9 of 14 positions shown, 12 images · non-contrast
Comparison: none

CLINICAL DATA: Leukocytosis and T-cell receptor arrangement. Need
for bone marrow biopsy for further workup.

[Series 2: i-spiral 5.0 b30f · axial · 0.96mm/px · z∈[-259,-161]mm · 9 of 36 slices shown, 12 images]
[im 4/36  soft-tissue]
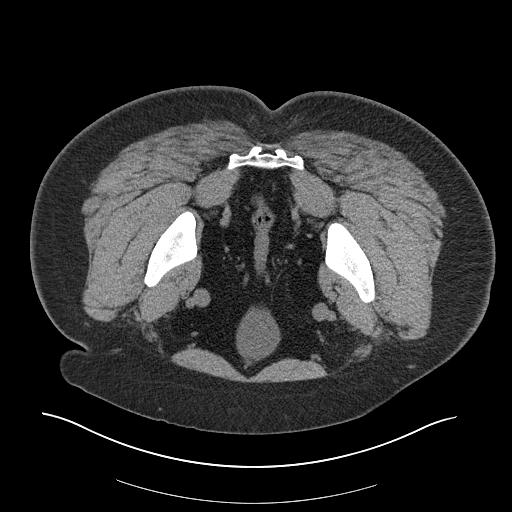
[im 4/36  bone]
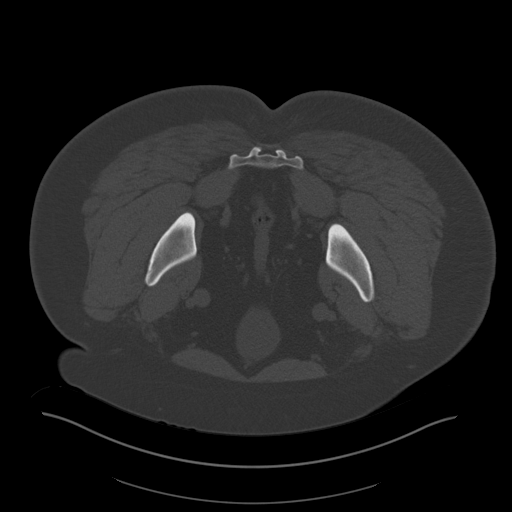
[im 8/36  bone]
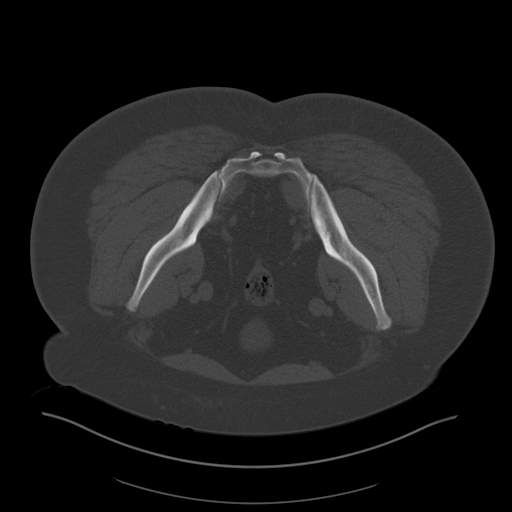
[im 11/36  bone]
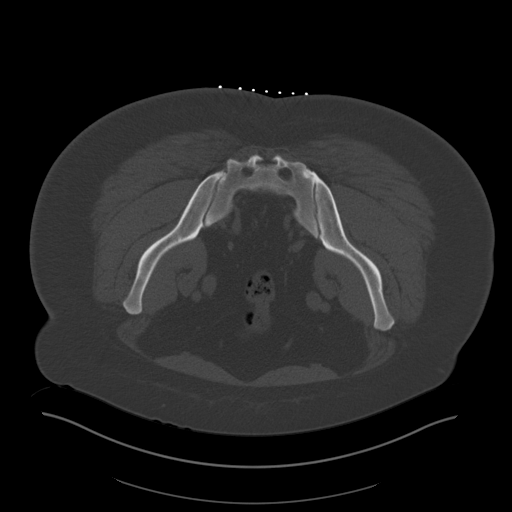
[im 15/36  bone]
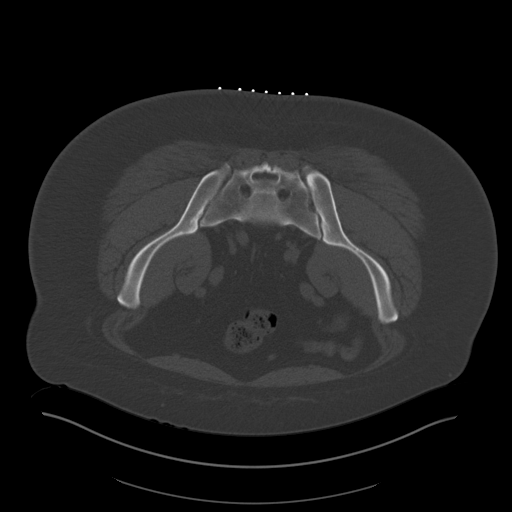
[im 18/36  soft-tissue]
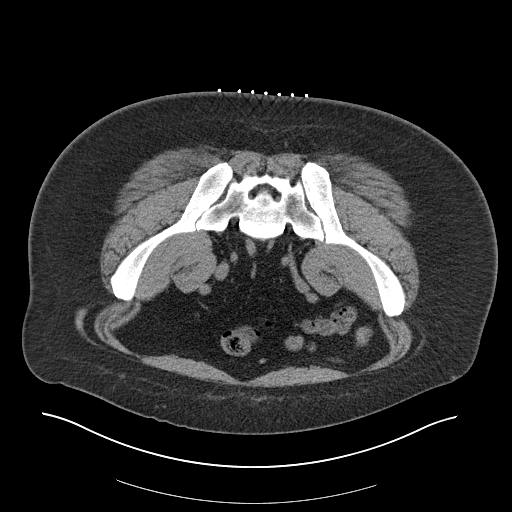
[im 18/36  bone]
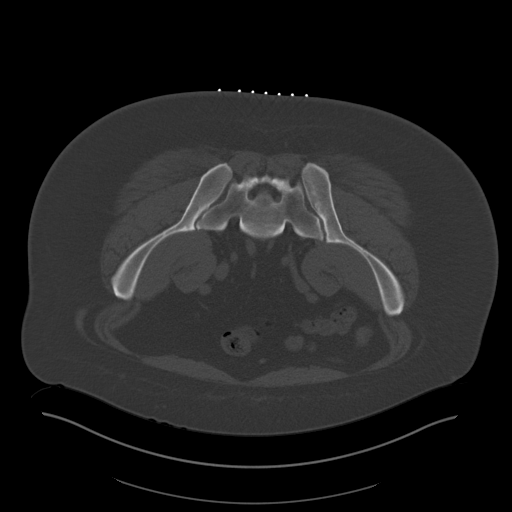
[im 22/36  bone]
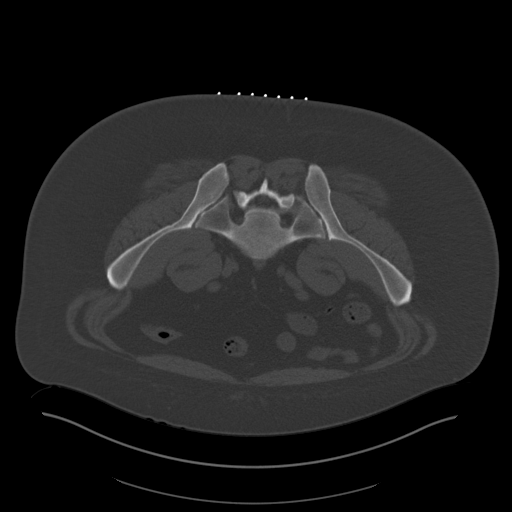
[im 25/36  bone]
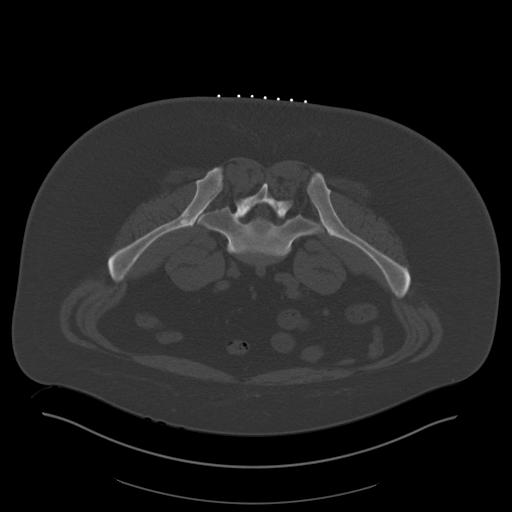
[im 29/36  bone]
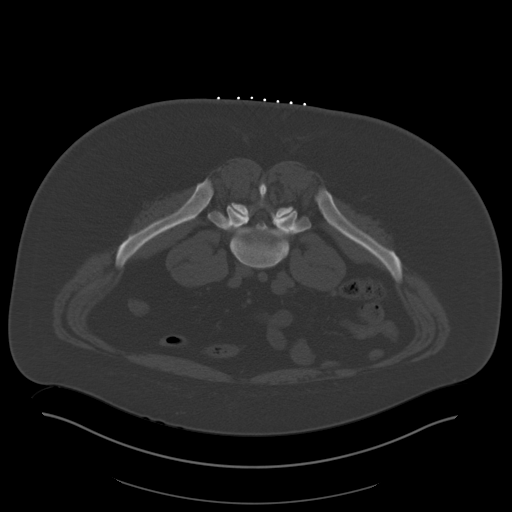
[im 32/36  soft-tissue]
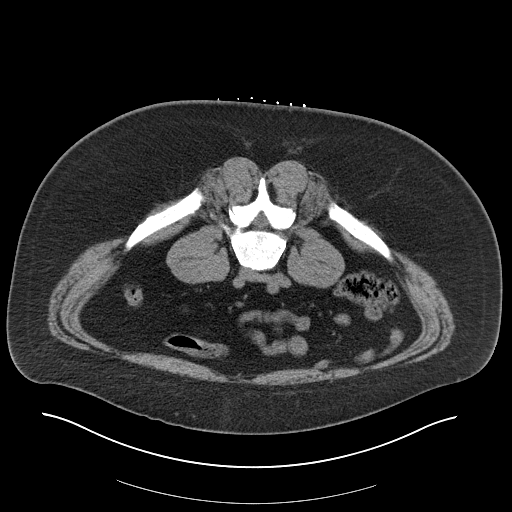
[im 32/36  bone]
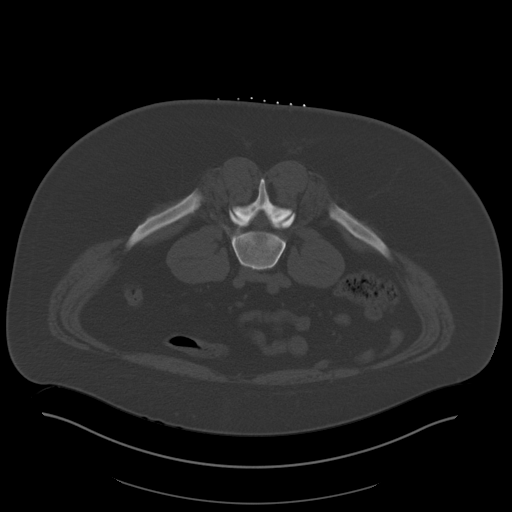

[9 of 14 positions shown; findings below may reference images not displayed]

EXAM:
CT GUIDED BONE MARROW ASPIRATION AND BIOPSY

ANESTHESIA/SEDATION:
Versed 0.5 mg IV, Fentanyl 100 mcg IV

Total Moderate Sedation Time:   25 minutes.

The patient's level of consciousness and physiologic status were
continuously monitored during the procedure by Radiology nursing.

PROCEDURE:
The procedure risks, benefits, and alternatives were explained to
the patient. Questions regarding the procedure were encouraged and
answered. The patient understands and consents to the procedure. A
time out was performed prior to initiating the procedure.

The right gluteal region was prepped with chlorhexidine. Sterile
gown and sterile gloves were used for the procedure. Local
anesthesia was provided with 1% Lidocaine.

Under CT guidance, an 11 gauge On Control bone cutting needle was
advanced from a posterior approach into the right iliac bone. Needle
positioning was confirmed with CT. Initial non heparinized and
heparinized aspirate samples were obtained of bone marrow. Core
biopsy was performed via the On Control drill needle.

COMPLICATIONS:
None
FINDINGS: Inspection of initial aspirate did reveal visible particles. Intact
core biopsy sample was obtained.
IMPRESSION: CT guided bone marrow biopsy of right posterior iliac bone with both
aspirate and core samples obtained.

## 2022-12-19 IMAGING — US US EXTREM LOW VENOUS*L*
1 series · 14 of 24 positions shown · non-contrast
Comparison: None.

CLINICAL DATA: Left calf pain and swelling. Recent hospitalization.

EXAM:
LEFT LOWER EXTREMITY VENOUS DOPPLER ULTRASOUND
TECHNIQUE: Gray-scale sonography with compression, as well as color and duplex
ultrasound, were performed to evaluate the deep venous system(s)
from the level of the common femoral vein through the popliteal and
proximal calf veins.

[Series 1: us extrem low venous*left* · 0.11mm/px · 14 of 34 slices shown]
[im 1/34]
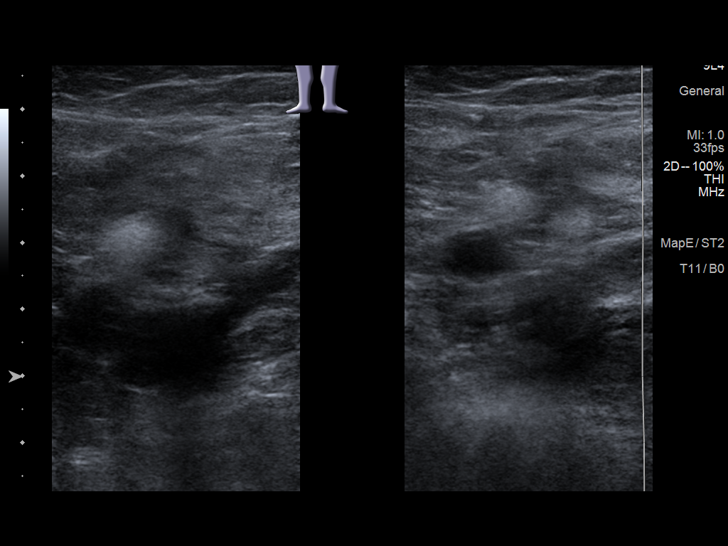
[im 3/34]
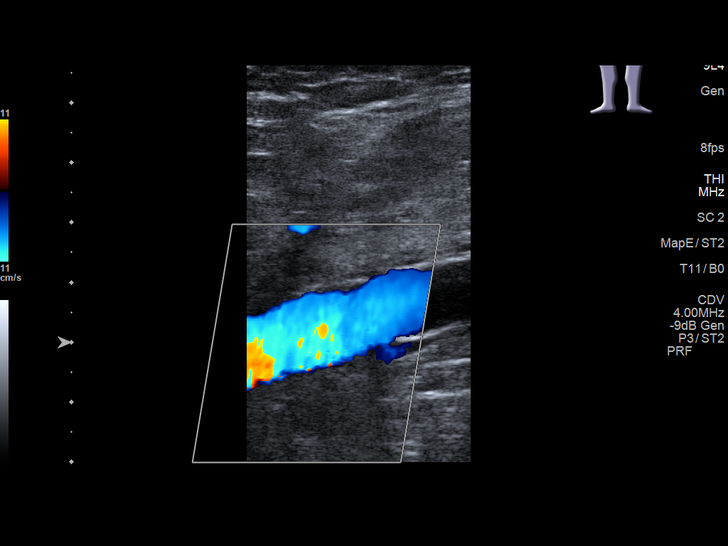
[im 6/34]
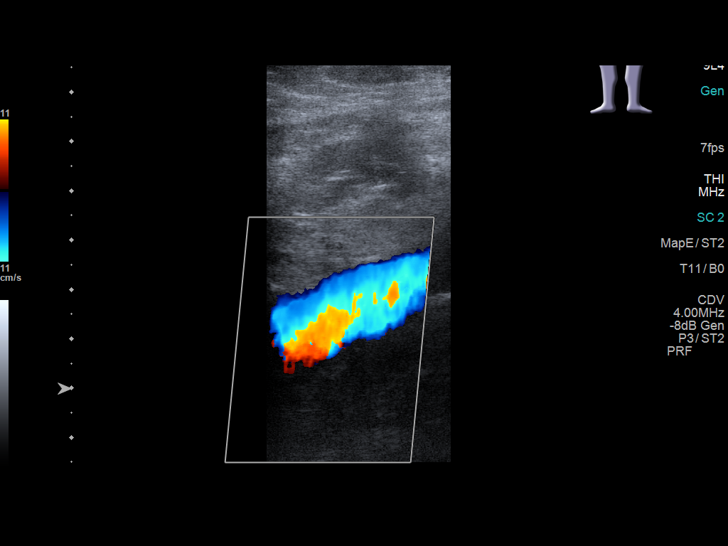
[im 9/34]
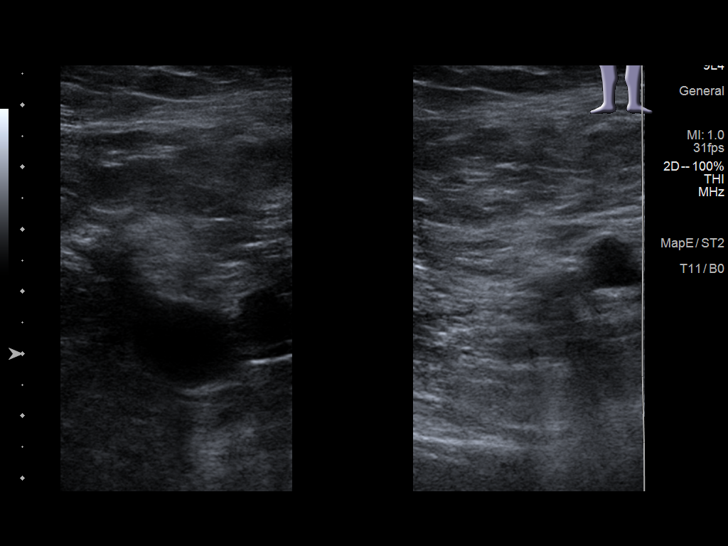
[im 11/34]
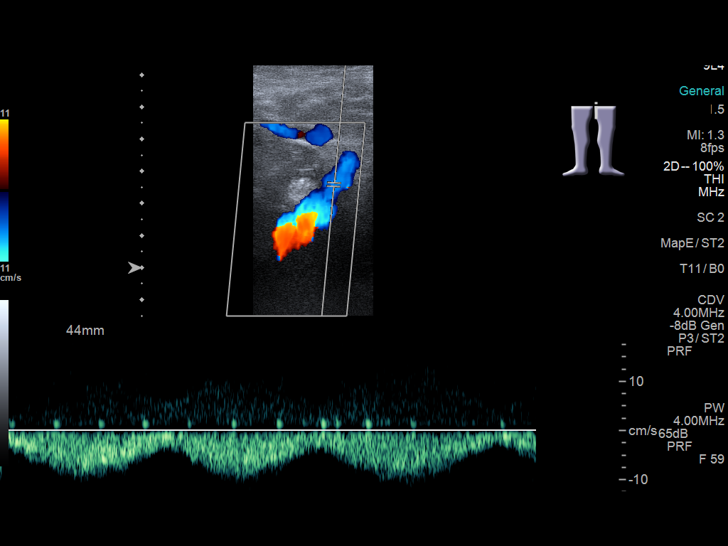
[im 13/34]
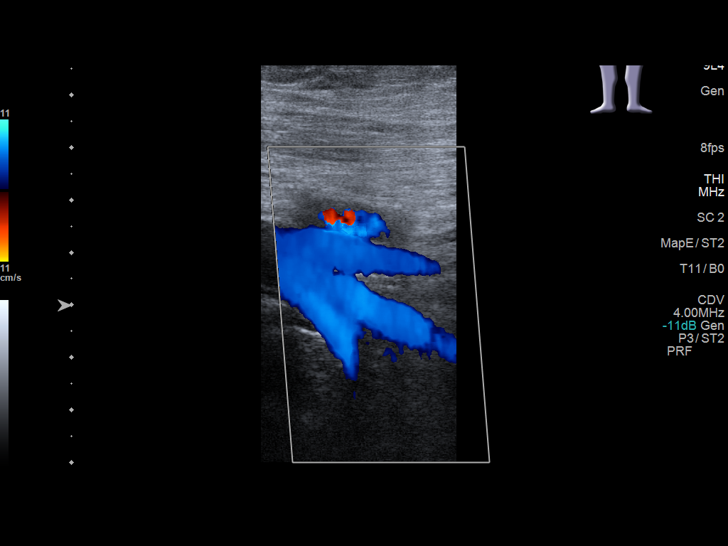
[im 16/34]
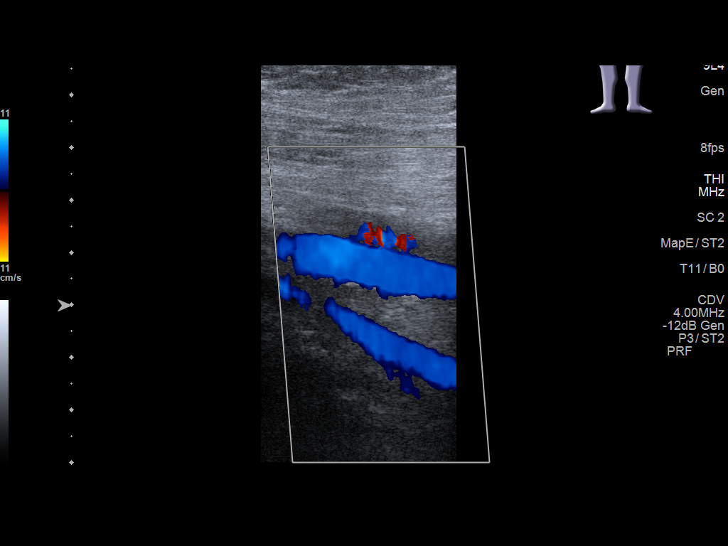
[im 18/34]
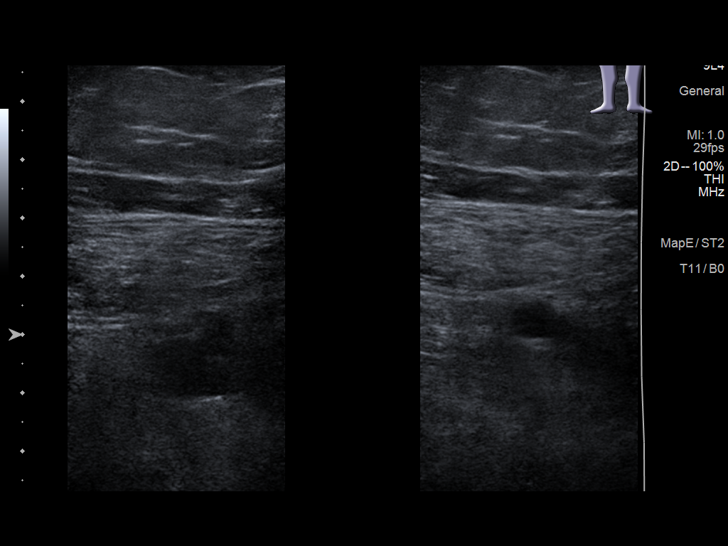
[im 21/34]
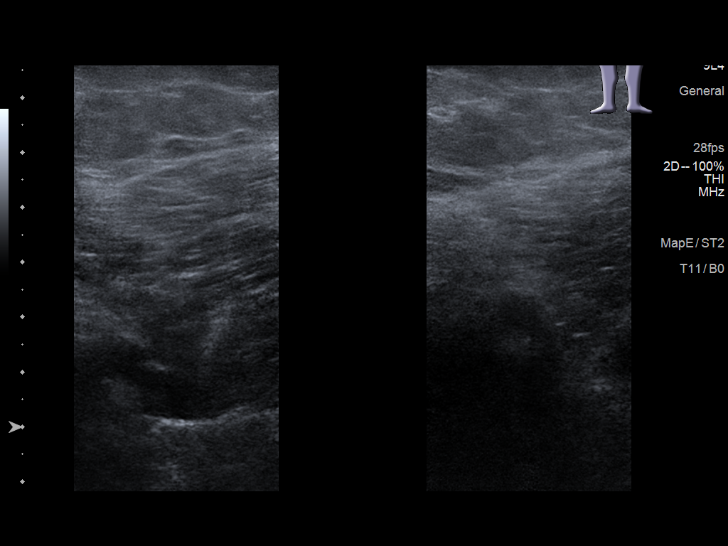
[im 23/34]
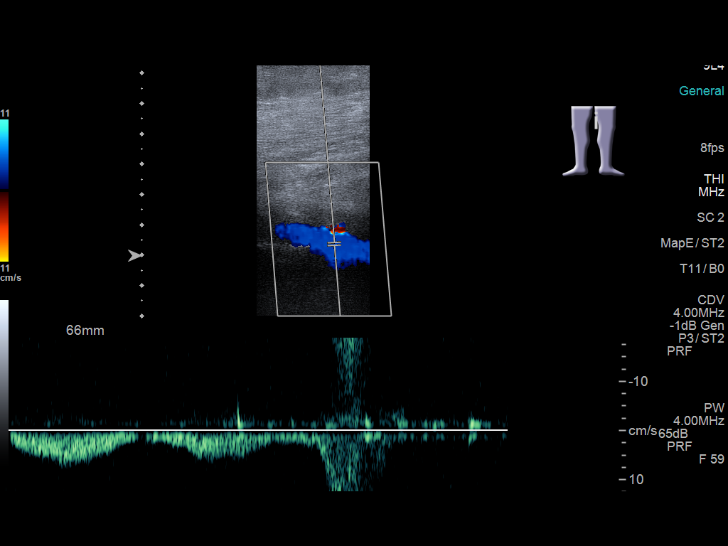
[im 26/34]
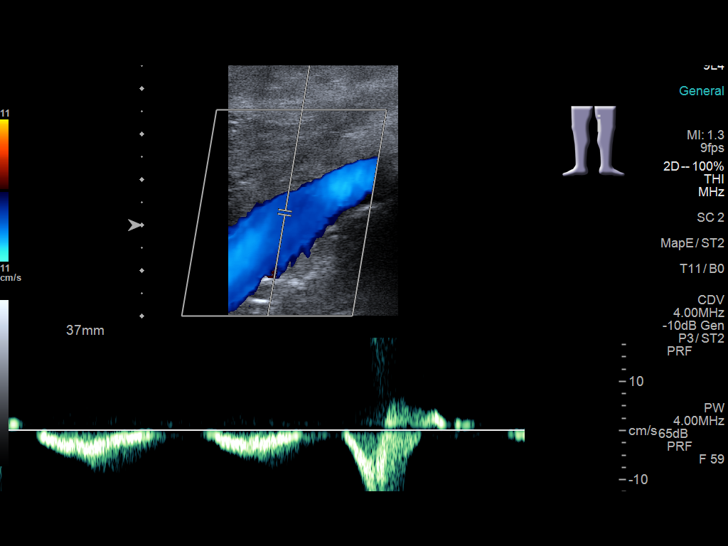
[im 28/34]
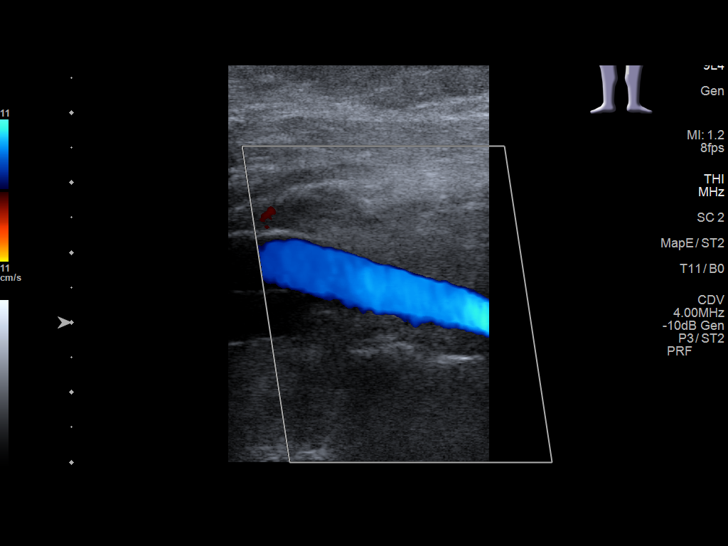
[im 31/34]
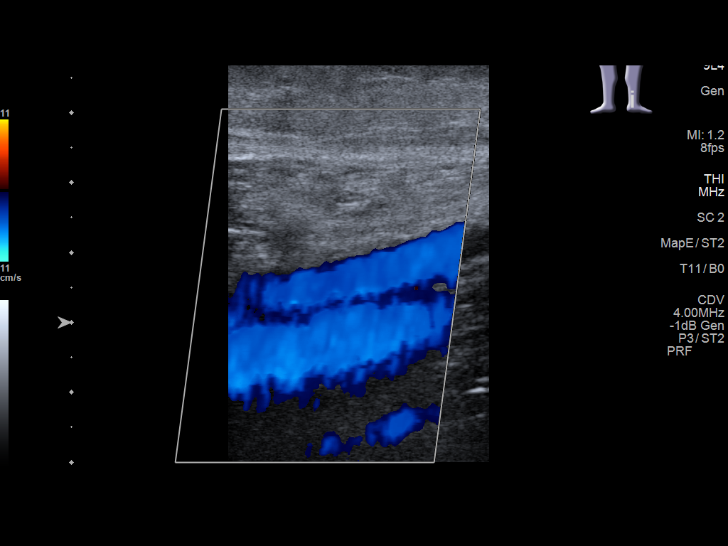
[im 34/34]
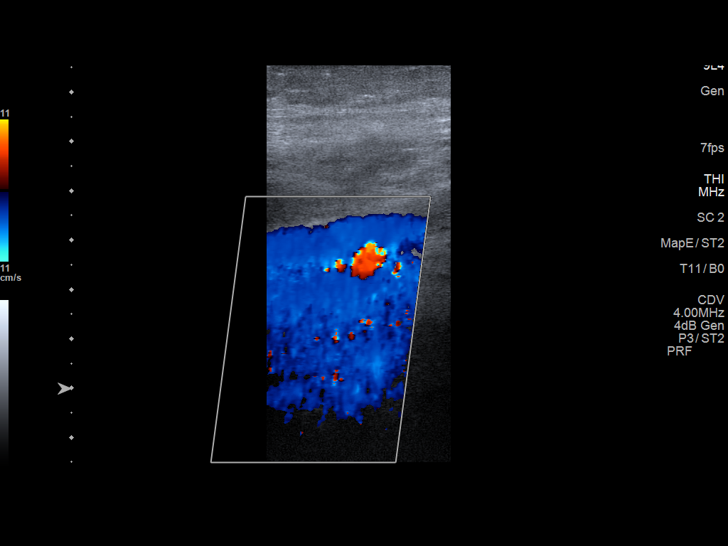

[14 of 24 positions shown; findings below may reference images not displayed]

FINDINGS: VENOUS

Normal compressibility of the common femoral, superficial femoral,
and popliteal veins, as well as the visualized calf veins.
Visualized portions of profunda femoral vein and great saphenous
vein unremarkable. No filling defects to suggest DVT on grayscale or
color Doppler imaging. Doppler waveforms show normal direction of
venous flow, normal respiratory plasticity and response to
augmentation.

Limited views of the contralateral common femoral vein are
unremarkable.

OTHER

None.

Limitations: None.
IMPRESSION: Negative.
